# Patient Record
Sex: Female | Born: 1970 | Race: White | Hispanic: No | Marital: Married | Smoking: Never smoker
Health system: Southern US, Community
[De-identification: ages and names within clinical notes are randomized; demographics above are authoritative.]

## PROBLEM LIST (undated history)

## (undated) DIAGNOSIS — G4733 Obstructive sleep apnea (adult) (pediatric): Secondary | ICD-10-CM

## (undated) DIAGNOSIS — E785 Hyperlipidemia, unspecified: Secondary | ICD-10-CM

## (undated) DIAGNOSIS — I10 Essential (primary) hypertension: Secondary | ICD-10-CM

## (undated) DIAGNOSIS — I440 Atrioventricular block, first degree: Secondary | ICD-10-CM

## (undated) DIAGNOSIS — E7800 Pure hypercholesterolemia, unspecified: Secondary | ICD-10-CM

## (undated) DIAGNOSIS — F419 Anxiety disorder, unspecified: Secondary | ICD-10-CM

## (undated) DIAGNOSIS — Z8719 Personal history of other diseases of the digestive system: Secondary | ICD-10-CM

## (undated) DIAGNOSIS — K5190 Ulcerative colitis, unspecified, without complications: Secondary | ICD-10-CM

## (undated) DIAGNOSIS — I471 Supraventricular tachycardia: Secondary | ICD-10-CM

## (undated) DIAGNOSIS — F411 Generalized anxiety disorder: Secondary | ICD-10-CM

## (undated) DIAGNOSIS — Z973 Presence of spectacles and contact lenses: Secondary | ICD-10-CM

## (undated) DIAGNOSIS — J309 Allergic rhinitis, unspecified: Secondary | ICD-10-CM

## (undated) DIAGNOSIS — K219 Gastro-esophageal reflux disease without esophagitis: Secondary | ICD-10-CM

## (undated) DIAGNOSIS — Z889 Allergy status to unspecified drugs, medicaments and biological substances status: Secondary | ICD-10-CM

## (undated) DIAGNOSIS — N920 Excessive and frequent menstruation with regular cycle: Secondary | ICD-10-CM

## (undated) HISTORY — DX: Essential (primary) hypertension: I10

## (undated) HISTORY — DX: Supraventricular tachycardia: I47.1

## (undated) HISTORY — DX: Gastro-esophageal reflux disease without esophagitis: K21.9

## (undated) HISTORY — DX: Allergy status to unspecified drugs, medicaments and biological substances: Z88.9

## (undated) HISTORY — DX: Pure hypercholesterolemia, unspecified: E78.00

## (undated) HISTORY — PX: OTHER SURGICAL HISTORY: SHX169

## (undated) HISTORY — DX: Anxiety disorder, unspecified: F41.9

## (undated) HISTORY — DX: Ulcerative colitis, unspecified, without complications: K51.90

---

## 1998-07-10 ENCOUNTER — Other Ambulatory Visit: Admission: RE | Admit: 1998-07-10 | Discharge: 1998-07-10 | Payer: Self-pay | Admitting: Gynecology

## 1999-10-03 ENCOUNTER — Other Ambulatory Visit: Admission: RE | Admit: 1999-10-03 | Discharge: 1999-10-03 | Payer: Self-pay | Admitting: Gynecology

## 2000-10-19 ENCOUNTER — Other Ambulatory Visit: Admission: RE | Admit: 2000-10-19 | Discharge: 2000-10-19 | Payer: Self-pay | Admitting: Gynecology

## 2001-11-09 ENCOUNTER — Other Ambulatory Visit: Admission: RE | Admit: 2001-11-09 | Discharge: 2001-11-09 | Payer: Self-pay | Admitting: Gynecology

## 2002-01-10 ENCOUNTER — Ambulatory Visit (HOSPITAL_COMMUNITY): Admission: RE | Admit: 2002-01-10 | Discharge: 2002-01-10 | Payer: Self-pay | Admitting: Gastroenterology

## 2002-01-10 ENCOUNTER — Encounter (INDEPENDENT_AMBULATORY_CARE_PROVIDER_SITE_OTHER): Payer: Self-pay

## 2002-12-05 ENCOUNTER — Other Ambulatory Visit: Admission: RE | Admit: 2002-12-05 | Discharge: 2002-12-05 | Payer: Self-pay | Admitting: Gynecology

## 2004-01-11 ENCOUNTER — Other Ambulatory Visit: Admission: RE | Admit: 2004-01-11 | Discharge: 2004-01-11 | Payer: Self-pay | Admitting: Gynecology

## 2005-02-24 ENCOUNTER — Other Ambulatory Visit: Admission: RE | Admit: 2005-02-24 | Discharge: 2005-02-24 | Payer: Self-pay | Admitting: Gynecology

## 2005-10-02 ENCOUNTER — Inpatient Hospital Stay (HOSPITAL_COMMUNITY): Admission: AD | Admit: 2005-10-02 | Discharge: 2005-10-05 | Payer: Self-pay | Admitting: Obstetrics and Gynecology

## 2009-03-24 ENCOUNTER — Emergency Department (HOSPITAL_BASED_OUTPATIENT_CLINIC_OR_DEPARTMENT_OTHER): Admission: EM | Admit: 2009-03-24 | Discharge: 2009-03-24 | Payer: Self-pay | Admitting: Emergency Medicine

## 2009-03-26 ENCOUNTER — Emergency Department (HOSPITAL_BASED_OUTPATIENT_CLINIC_OR_DEPARTMENT_OTHER): Admission: EM | Admit: 2009-03-26 | Discharge: 2009-03-27 | Payer: Self-pay | Admitting: Emergency Medicine

## 2009-03-26 ENCOUNTER — Ambulatory Visit: Payer: Self-pay | Admitting: Radiology

## 2009-03-26 ENCOUNTER — Ambulatory Visit (HOSPITAL_BASED_OUTPATIENT_CLINIC_OR_DEPARTMENT_OTHER): Admission: RE | Admit: 2009-03-26 | Discharge: 2009-03-26 | Payer: Self-pay | Admitting: Emergency Medicine

## 2009-03-29 ENCOUNTER — Ambulatory Visit (HOSPITAL_COMMUNITY): Admission: RE | Admit: 2009-03-29 | Discharge: 2009-03-30 | Payer: Self-pay | Admitting: General Surgery

## 2009-03-29 ENCOUNTER — Encounter (INDEPENDENT_AMBULATORY_CARE_PROVIDER_SITE_OTHER): Payer: Self-pay | Admitting: General Surgery

## 2009-03-29 HISTORY — PX: CHOLECYSTECTOMY, LAPAROSCOPIC: SHX56

## 2010-07-15 LAB — CBC
Hemoglobin: 11.4 g/dL — ABNORMAL LOW (ref 12.0–15.0)
MCHC: 32.7 g/dL (ref 30.0–36.0)
Platelets: 356 10*3/uL (ref 150–400)
RDW: 14.9 % (ref 11.5–15.5)

## 2010-07-15 LAB — DIFFERENTIAL
Eosinophils Relative: 0 % (ref 0–5)
Lymphs Abs: 1.2 10*3/uL (ref 0.7–4.0)
Neutro Abs: 9.9 10*3/uL — ABNORMAL HIGH (ref 1.7–7.7)
Neutrophils Relative %: 84 % — ABNORMAL HIGH (ref 43–77)

## 2010-07-16 LAB — COMPREHENSIVE METABOLIC PANEL
ALT: 9 U/L (ref 0–35)
AST: 12 U/L (ref 0–37)
Calcium: 10 mg/dL (ref 8.4–10.5)
Calcium: 9.3 mg/dL (ref 8.4–10.5)
Chloride: 103 mEq/L (ref 96–112)
Creatinine, Ser: 0.7 mg/dL (ref 0.4–1.2)
GFR calc Af Amer: >60 mL/min (ref >60)
GFR calc non Af Amer: >60 mL/min (ref >60)
Potassium: 4.3 mEq/L (ref 3.5–5.1)
Sodium: 142 mEq/L (ref 135–145)
Total Bilirubin: 0.6 mg/dL (ref 0.3–1.2)
Total Bilirubin: 0.8 mg/dL (ref 0.3–1.2)

## 2010-07-16 LAB — CBC
HCT: 39 % (ref 36.0–46.0)
HCT: 46.8 % — ABNORMAL HIGH (ref 36.0–46.0)
Hemoglobin: 15.8 g/dL — ABNORMAL HIGH (ref 12.0–15.0)
MCHC: 33.6 g/dL (ref 30.0–36.0)
MCHC: 33.7 g/dL (ref 30.0–36.0)
MCV: 80.3 fL (ref 78.0–100.0)
MCV: 80.5 fL (ref 78.0–100.0)
Platelets: 401 K/uL — ABNORMAL HIGH (ref 150–400)
RBC: 4.85 MIL/uL (ref 3.87–5.11)
RBC: 5.81 MIL/uL — ABNORMAL HIGH (ref 3.87–5.11)
RDW: 13.4 % (ref 11.5–15.5)
RDW: 13.6 % (ref 11.5–15.5)
WBC: 17.3 K/uL — ABNORMAL HIGH (ref 4.0–10.5)

## 2010-07-16 LAB — DIFFERENTIAL
Basophils Relative: 4 % — ABNORMAL HIGH (ref 0–1)
Eosinophils Relative: 0 % (ref 0–5)
Lymphs Abs: 1.5 10*3/uL (ref 0.7–4.0)
Monocytes Absolute: 0.6 10*3/uL (ref 0.1–1.0)
Monocytes Relative: 4 % (ref 3–12)
Monocytes Relative: 5 % (ref 3–12)
Neutro Abs: 11.7 10*3/uL — ABNORMAL HIGH (ref 1.7–7.7)

## 2010-07-16 LAB — URINALYSIS, ROUTINE W REFLEX MICROSCOPIC
Glucose, UA: NEGATIVE mg/dL
Ketones, ur: >80 mg/dL — AB
Nitrite: NEGATIVE
Protein, ur: 100 mg/dL — AB
Specific Gravity, Urine: 1.033 — ABNORMAL HIGH (ref 1.005–1.030)
Urobilinogen, UA: 1 mg/dL (ref 0.0–1.0)
pH: 6.5 (ref 5.0–8.0)

## 2010-07-16 LAB — URINE MICROSCOPIC-ADD ON

## 2010-07-16 LAB — LIPASE, BLOOD: Lipase: 30 U/L (ref 23–300)

## 2010-07-16 LAB — PREGNANCY, URINE: Preg Test, Ur: NEGATIVE

## 2010-08-30 NOTE — Discharge Summary (Signed)
NAME:  ELIAS, BORDNER NO.:  [id number]   MEDICAL RECORD NO.:  [id number]          PATIENT TYPE:  INP   LOCATION:  9125                          FACILITY:  [facility]   PHYSICIAN:  Ilda Mori, M.D.   DATE OF BIRTH:  [birth year]   DATE OF ADMISSION:  10/02/2005  DATE OF DISCHARGE:  10/05/2005                                 DISCHARGE SUMMARY   FINAL DIAGNOSES:  1.  Intrauterine pregnancy at [redacted] weeks gestation.  2.  Spontaneous rupture of membranes.  3.  Breech presentation.   PROCEDURE:  Primary low flap transverse cesarean section.   SURGEON:  Dr. Miguel Aschoff.   COMPLICATIONS:  None.   HISTORY:  This 40 year old G1 and P0 presents at 40 and [redacted] weeks gestation  with spontaneous rupture of membranes.  The patient's antepartum course up  to this point has been uncomplicated.  The patient is advanced maternal age.  The patient did have amniocentesis.  The patient did see [facility] for  genetic counseling.  I do not see where she had an amnio or not.  She also  has a history of ulcerative colitis but seemed to be stable during her  pregnancy.   On admission, to the [facility], the patient was noted to have a  breech presentation.  At this time, a decision was made to proceed with a  cesarean section.  The patient was taken the operating room on October 02, 2005  by Dr. Miguel Aschoff where a primary low flap transverse cesarean section was  performed with the delivery of a 6 pounds 11 ounce female infant with Apgar's  of 9 and 9.  Delivery went without complications.  The patient's  postoperative course was benign without significant fevers.  She was felt  ready for discharge on postoperative day #3, was sent home on a regular  diet, told to decrease activities, told to continue prenatal vitamins, was  given Tylox #30 one to two every 4 hours as needed for pain, was to follow  up in the office in four weeks.   LABS ON DISCHARGE:  The patient had a hemoglobin of  9.4, white blood cell  count of 8.9, platelets 238,000.      Leilani Able, P.A.-C.      Ilda Mori, M.D.  Electronically Signed    MB/MEDQ  D:  11/13/2005  T:  11/14/2005  Job:  [id number]

## 2010-08-30 NOTE — Op Note (Signed)
NAME:  Carrie Savage, Carrie Savage NO.:  [id number]   MEDICAL RECORD NO.:  [id number]          PATIENT TYPE:  INP   LOCATION:  9125                          FACILITY:  [facility]   PHYSICIAN:  Miguel Aschoff, M.D.       DATE OF BIRTH:  07/10/1970   DATE OF PROCEDURE:  10/02/2005  DATE OF DISCHARGE:                                 OPERATIVE REPORT   PREOPERATIVE DIAGNOSES:  1.  Intrauterine pregnancy at 36 weeks.  2.  Spontaneous rupture of membranes.  3.  Breech presentation.   POSTOPERATIVE DIAGNOSES:  1.  Intrauterine pregnancy at 36 weeks.  2.  Spontaneous rupture of membranes.  3.  Breech presentation.  4.  Delivery of a viable female infant, Apgar 9/9.   PROCEDURE:  Primary low flap transverse cesarean section.   SURGEON:  Miguel Aschoff, M.D.   ANESTHESIA:  Spinal.   COMPLICATIONS:  None.   JUSTIFICATION:  The patient is a 40 year old white female at approximately  70 weeks' gestation who has had spontaneous rupture of membranes.  On  examination she appeared to have a malpresentation and an ultrasound  examination was carried out and revealed a breech presentation.  With the  malpresentation in a primigravida, it was recommended that the patient  undergo a primary cesarean section.  The risks and benefits were discussed  with the patient and informed consent was obtained.   PROCEDURE:  The patient was taken to the operating room and placed in a  sitting position and spinal anesthesia was administered without difficulty.  After this was done, she was placed in a supine position and prepped and  draped in the usual sterile fashion.  A Foley catheter was inserted.  At  this point a Pfannenstiel incision was made and extended down into the  subcutaneous tissue with bleeding points being clamped and coagulated as  they were encountered.  The fascia was then identified and incised  transversely and then separated from the underlying rectus muscles.  The  rectus muscles were  divided in the midline, the peritoneum was then found  and entered, carefully avoiding underlying structures.  The peritoneal  incision was then extended under direct visualization.  At this point a  bladder flap was created and protected with a bladder blade.  Then an  elliptical transverse incision was made into the lower uterine segment.  The  amniotic cavity was entered and at this point the patient was delivered of a  viable female infant, Apgar 9 at one minute and 9 at five minutes from a frank  breech left sacrum anterior position.  The baby was handed to the pediatric  team in attendance.  Cord bloods were obtained for appropriate studies.  The  placenta was then delivered without difficulty.  The uterus was then  evacuated of any remaining products of conception.  Then the uterus was  closed.  The angles of the uterine incision were closed using figure-of-  eight sutures of #1 Vicryl.  Then the uterus was closed in layers.  The  first layer was a running interlocking  suture of #1 Vicryl, followed by an  imbricating suture of #1 Vicryl.  The bladder flap was reapproximated using  running continuous 2-0 Vicryl sutures.  At this point lap counts and  instrument counts were found to be correct and then the abdomen was closed.  The parietal peritoneum was closed using running continuous 0 Vicryl suture.  The rectus muscles were reapproximated using running continuous 0 Vicryl  sutures.  The fascia was then closed using 2 sutures of 0 Vicryl each  starting at the lateral fascial angles and meeting in the midline.  The  subcutaneous tissue was closed using interrupted 2-0 Vicryl sutures and the  skin incision was closed using staples.  The estimated blood loss was  approximately 600 mL.  The patient went to the recovery room in satisfactory  condition.      Miguel Aschoff, M.D.  Electronically Signed     AR/MEDQ  D:  10/02/2005  T:  10/03/2005  Job:  [id number]

## 2010-08-30 NOTE — Op Note (Signed)
   NAME:  Carrie Savage, Carrie Savage NO.:  [id number]   MEDICAL RECORD NO.:  [id number]                   PATIENT TYPE:  AMB   LOCATION:  ENDO                                 FACILITY:  [facility]   PHYSICIAN:  Danise Edge, M.D.                DATE OF BIRTH:  12-Jul-1970   DATE OF PROCEDURE:  01/10/2002  DATE OF DISCHARGE:                                 OPERATIVE REPORT   PROCEDURE:  Surveillance colonoscopy with biopsy.   INDICATIONS:  The patient is a 39 year old female born 05/17/1970.  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  She is off all  medication and for the past five to 10 years, her colitis has been inactive.  She is scheduled to undergo surveillance colonoscopy with biopsy to rule out  dysplasia.   ENDOSCOPIST:  Danise Edge, M.D.   PREMEDICATION:  Versed 10 mg, Demerol 100 mg.   ENDOSCOPE:  Olympus pediatric colonoscope.   DESCRIPTION OF PROCEDURE:  After obtaining informed consent, the patient was  placed in the left lateral decubitus position.  I administered intravenous  Demerol and intravenous Versed to achieve conscious sedation for the  procedure.  The patient's blood pressure, oxygen saturation, and cardiac  rhythm were monitored throughout the procedure and documented in the medical  record.   Anal inspection was normal.  Digital rectal exam was normal.  The Olympus  pediatric video colonoscope was introduced into the rectum and advanced to  the cecum.  The ileocecal valve was intubated and the distal ileum  inspected.  Colonic preparation for the exam today was excellent.   Rectum normal.   Sigmoid colon and descending colon normal.   Splenic flexure normal.   Transverse colon normal.   Hepatic flexure normal.   Ascending colon normal.   Cecum and ileocecal valve normal.   Distal ileum normal.   Biopsies:  Eight biopsies were taken from the left colon, eight biopsies  were taken from the  transverse colon, eight biopsies were taken from the  left colon, and seven biopsies were taken from the rectosigmoid colon.  Biopsies were submitted to rule out mucosal dysplasia.   ASSESSMENT:  Normal proctocolonoscopy with distal ileoscopy.  Random colonic  biopsies pending to rule out dysplasia.   RECOMMENDATIONS:  Repeat colonoscopy in three years.                                                 Danise Edge, M.D.    MJ/MEDQ  D:  01/10/2002  T:  01/10/2002  Job:  [id number]

## 2011-03-27 ENCOUNTER — Other Ambulatory Visit: Payer: Self-pay | Admitting: Gynecology

## 2011-09-02 ENCOUNTER — Ambulatory Visit
Admission: RE | Admit: 2011-09-02 | Discharge: 2011-09-02 | Disposition: A | Discharge status: Home / Self Care | Payer: BC Managed Care – PPO | Source: Ambulatory Visit | Attending: Allergy and Immunology | Admitting: Allergy and Immunology

## 2011-09-02 ENCOUNTER — Other Ambulatory Visit: Payer: Self-pay | Admitting: Allergy and Immunology

## 2011-09-02 DIAGNOSIS — R05 Cough: Secondary | ICD-10-CM

## 2011-09-02 DIAGNOSIS — R059 Cough, unspecified: Secondary | ICD-10-CM

## 2011-12-05 ENCOUNTER — Other Ambulatory Visit: Payer: Self-pay

## 2012-06-28 ENCOUNTER — Other Ambulatory Visit: Payer: Self-pay | Admitting: Gynecology

## 2013-07-25 ENCOUNTER — Other Ambulatory Visit: Payer: Self-pay | Admitting: Gynecology

## 2016-04-17 ENCOUNTER — Other Ambulatory Visit: Payer: Self-pay | Admitting: Gastroenterology

## 2016-06-10 ENCOUNTER — Encounter (HOSPITAL_COMMUNITY)
Admission: RE | Disposition: A | Discharge status: Home / Self Care | Payer: Self-pay | Source: Ambulatory Visit | Attending: Gastroenterology

## 2016-06-10 ENCOUNTER — Ambulatory Visit (HOSPITAL_COMMUNITY)
Admission: RE | Admit: 2016-06-10 | Discharge: 2016-06-10 | Disposition: A | Discharge status: Home / Self Care | Payer: BC Managed Care – PPO | Source: Ambulatory Visit | Attending: Gastroenterology | Admitting: Gastroenterology

## 2016-06-10 ENCOUNTER — Ambulatory Visit (HOSPITAL_COMMUNITY): Payer: BC Managed Care – PPO | Admitting: Anesthesiology

## 2016-06-10 ENCOUNTER — Encounter (HOSPITAL_COMMUNITY): Payer: Self-pay

## 2016-06-10 DIAGNOSIS — Z882 Allergy status to sulfonamides status: Secondary | ICD-10-CM | POA: Diagnosis not present

## 2016-06-10 DIAGNOSIS — Z1211 Encounter for screening for malignant neoplasm of colon: Secondary | ICD-10-CM | POA: Diagnosis not present

## 2016-06-10 DIAGNOSIS — E669 Obesity, unspecified: Secondary | ICD-10-CM | POA: Diagnosis not present

## 2016-06-10 DIAGNOSIS — E7800 Pure hypercholesterolemia, unspecified: Secondary | ICD-10-CM | POA: Diagnosis not present

## 2016-06-10 DIAGNOSIS — Z79899 Other long term (current) drug therapy: Secondary | ICD-10-CM | POA: Diagnosis not present

## 2016-06-10 DIAGNOSIS — F419 Anxiety disorder, unspecified: Secondary | ICD-10-CM | POA: Insufficient documentation

## 2016-06-10 DIAGNOSIS — I10 Essential (primary) hypertension: Secondary | ICD-10-CM | POA: Insufficient documentation

## 2016-06-10 DIAGNOSIS — Z6838 Body mass index (BMI) 38.0-38.9, adult: Secondary | ICD-10-CM | POA: Diagnosis not present

## 2016-06-10 DIAGNOSIS — K5100 Ulcerative (chronic) pancolitis without complications: Secondary | ICD-10-CM | POA: Insufficient documentation

## 2016-06-10 DIAGNOSIS — K219 Gastro-esophageal reflux disease without esophagitis: Secondary | ICD-10-CM | POA: Insufficient documentation

## 2016-06-10 HISTORY — PX: COLONOSCOPY WITH PROPOFOL: SHX5780

## 2016-06-10 SURGERY — COLONOSCOPY WITH PROPOFOL
Anesthesia: Monitor Anesthesia Care

## 2016-06-10 MED ORDER — PROPOFOL 500 MG/50ML IV EMUL
INTRAVENOUS | Status: DC | PRN
  Administered 2016-06-10: 50 mg via INTRAVENOUS
  Administered 2016-06-10: 60 mg via INTRAVENOUS

## 2016-06-10 MED ORDER — PROPOFOL 500 MG/50ML IV EMUL
INTRAVENOUS | Status: DC | PRN
  Administered 2016-06-10: 125 ug/kg/min via INTRAVENOUS

## 2016-06-10 MED ORDER — LACTATED RINGERS IV SOLN
INTRAVENOUS | Status: DC | PRN
  Administered 2016-06-10: 10:00:00 via INTRAVENOUS

## 2016-06-10 MED ORDER — MIDAZOLAM HCL 2 MG/2ML IJ SOLN
INTRAMUSCULAR | Status: AC
  Filled 2016-06-10: qty 2

## 2016-06-10 MED ORDER — MIDAZOLAM HCL 5 MG/5ML IJ SOLN
INTRAMUSCULAR | Status: DC | PRN
  Administered 2016-06-10: 2 mg via INTRAVENOUS

## 2016-06-10 MED ORDER — PROPOFOL 10 MG/ML IV BOLUS
INTRAVENOUS | Status: AC
  Filled 2016-06-10: qty 40

## 2016-06-10 SURGICAL SUPPLY — 22 items

## 2016-06-10 NOTE — H&P (Signed)
Procedure: Surveillance colonoscopy. 12/23/2004 normal surveillance colonoscopy was performed. Surveillance mucosal biopsies did not show colitis or dysplasia. Universal ulcerative proctocolitis was diagnosed in 1982.  History: The patient is a 46 year old female born 11/24/1970. She was diagnosed with Universal ulcerative colitis in 1982. She underwent a normal surveillance colonoscopy in September 2006. She does not take maintenance therapy for ulcerative colitis and has not had bloody diarrhea to suggest a flare in her colitis for at least 11 years.  She is scheduled to undergo surveillance colonoscopy today.  Medication allergies: Sulfa drugs cause hives. Lipitor and Crestor caused elevated liver transaminases. Lisinopril causes cough  Past medical history: Cholecystectomy. Cesarean section. Universal ulcerative colitis was diagnosed in 1982. Hypertension. Hypercholesterolemia. Anxiety. Gastroesophageal reflux.  Exam: The patient is alert and lying comfortably on the endoscopy stretcher. Abdomen is soft and nontender to palpation. Lungs are clear to auscultation. Cardiac exam reveals a regular rhythm.  Plan: Proceed with surveillance colonoscopy

## 2016-06-10 NOTE — Anesthesia Postprocedure Evaluation (Signed)
Anesthesia Post Note  Patient: Carrie Savage  Procedure(s) Performed: Procedure(s) (LRB): COLONOSCOPY WITH PROPOFOL (N/A)  Patient location during evaluation: PACU Anesthesia Type: MAC Level of consciousness: awake and alert Pain management: pain level controlled Vital Signs Assessment: post-procedure vital signs reviewed and stable Respiratory status: spontaneous breathing Cardiovascular status: stable Anesthetic complications: no       Last Vitals:  Vitals:   06/10/16 1020 06/10/16 1030  BP: (!) 128/55 (!) 150/88  Pulse: 69 70  Resp: 16 18  Temp:      Last Pain:  Vitals:   06/10/16 1016  TempSrc: Oral                 Nolon Nations

## 2016-06-10 NOTE — Anesthesia Preprocedure Evaluation (Signed)
Anesthesia Evaluation  Patient identified by MRN, date of birth, ID band Patient awake    Reviewed: Allergy & Precautions, NPO status , Patient's Chart, lab work & pertinent test results, reviewed documented beta blocker date and time   Airway Mallampati: II  TM Distance: >3 FB Neck ROM: Full    Dental no notable dental hx.    Pulmonary neg pulmonary ROS,    Pulmonary exam normal breath sounds clear to auscultation       Cardiovascular hypertension, Pt. on medications and Pt. on home beta blockers Normal cardiovascular exam Rhythm:Regular Rate:Normal     Neuro/Psych negative neurological ROS  negative psych ROS   GI/Hepatic negative GI ROS, Neg liver ROS,   Endo/Other  negative endocrine ROS  Renal/GU negative Renal ROS     Musculoskeletal negative musculoskeletal ROS (+)   Abdominal (+) + obese,   Peds  Hematology negative hematology ROS (+)   Anesthesia Other Findings   Reproductive/Obstetrics negative OB ROS                             Anesthesia Physical Anesthesia Plan  ASA: II  Anesthesia Plan: MAC   Post-op Pain Management:    Induction: Intravenous  Airway Management Planned:   Additional Equipment:   Intra-op Plan:   Post-operative Plan:   Informed Consent: I have reviewed the patients History and Physical, chart, labs and discussed the procedure including the risks, benefits and alternatives for the proposed anesthesia with the patient or authorized representative who has indicated his/her understanding and acceptance.   Dental advisory given  Plan Discussed with: CRNA  Anesthesia Plan Comments:         Anesthesia Quick Evaluation

## 2016-06-10 NOTE — Discharge Instructions (Signed)

## 2016-06-10 NOTE — Transfer of Care (Signed)
Immediate Anesthesia Transfer of Care Note  Patient: Carrie Savage  Procedure(s) Performed: Procedure(s): COLONOSCOPY WITH PROPOFOL (N/A)  Patient Location: PACU  Anesthesia Type:MAC  Level of Consciousness:  sedated, patient cooperative and responds to stimulation  Airway & Oxygen Therapy:Patient Spontanous Breathing and Patient connected to face mask oxgen  Post-op Assessment:  Report given to PACU RN and Post -op Vital signs reviewed and stable  Post vital signs:  Reviewed and stable  Last Vitals:  Vitals:   06/10/16 0853  BP: (!) 154/92  Pulse: 84  Resp: (!) 22  Temp: 77.8 C    Complications: No apparent anesthesia complications

## 2016-06-10 NOTE — Op Note (Signed)
[facility] Patient Name: Carrie Savage Procedure Date: 06/10/2016 MRN: [id number] Attending MD: Carrie Savage , MD Date of Birth: 09-09-1970 CSN: [id number] Age: 46 Admit Type: Outpatient Procedure:                Colonoscopy Indications:              High risk colon cancer surveillance: Ulcerative                            pancolitis was diagnosed in 1982 Providers:                Carrie Bumpers, MD, Carrie Edwards RN, RN, Carrie Savage, Technician, Carrie Right, CRNA Referring MD:              Medicines:                Propofol per Anesthesia Complications:            No immediate complications. Estimated Blood Loss:     Estimated blood loss was minimal. Procedure:                Pre-Anesthesia Assessment:                           - Prior to the procedure, a History and Physical                            was performed, and patient medications and                            allergies were reviewed. The patient's tolerance of                            previous anesthesia was also reviewed. The risks                            and benefits of the procedure and the sedation                            options and risks were discussed with the patient.                            All questions were answered, and informed consent                            was obtained. Prior Anticoagulants: The patient has                            taken no previous anticoagulant or antiplatelet                            agents. ASA Grade Assessment: II - A patient with  mild systemic disease. After reviewing the risks                            and benefits, the patient was deemed in                            satisfactory condition to undergo the procedure.                           After obtaining informed consent, the colonoscope                            was passed under direct vision. Throughout the   procedure, the patient's blood pressure, pulse, and                            oxygen saturations were monitored continuously. The                            EC-[id number] ([id number]) scope was introduced through                            the anus and advanced to the the cecum, identified                            by appendiceal orifice and ileocecal valve. The                            colonoscopy was performed without difficulty. The                            patient tolerated the procedure well. The quality                            of the bowel preparation was good. The terminal                            ileum, the ileocecal valve, the appendiceal orifice                            and the rectum were photographed. Scope In: 9:44:40 AM Scope Out: 10:09:16 AM Scope Withdrawal Time: 0 hours 19 minutes 51 seconds  Total Procedure Duration: 0 hours 24 minutes 36 seconds  Findings:      The perianal and digital rectal examinations were normal.      The entire examined colon appeared normal. Four quadrant biopsies were       performed approximately every 10 cm from the cecum to the rectum. A       total of 32 random colon biopsies were performed. Impression:               - The entire examined colon is normal.                           - No specimens collected. Moderate Sedation:  N/A- Per Anesthesia Care Recommendation:           - Patient has a contact number available for                            emergencies. The signs and symptoms of potential                            delayed complications were discussed with the                            patient. Return to normal activities tomorrow.                            Written discharge instructions were provided to the                            patient.                           - Repeat colonoscopy date to be determined after                            pending pathology results are reviewed for                             surveillance.                           - Resume previous diet.                           - Continue present medications. Procedure Code(s):        --- Professional ---                           Z6109G0105, Colorectal cancer screening; colonoscopy on                            individual at high risk Diagnosis Code(s):        --- Professional ---                           K51.00, Ulcerative (chronic) pancolitis without                            complications CPT copyright 2016 American Medical Association. All rights reserved. The codes documented in this report are preliminary and upon coder review may  be revised to meet current compliance requirements. Carrie EdgeMartin Jaterrius Ricketson, MD Carrie BumpersMartin K Zyiere Rosemond, MD 06/10/2016 10:16:17 AM This report has been signed electronically. Number of Addenda: 0

## 2016-06-11 ENCOUNTER — Encounter (HOSPITAL_COMMUNITY): Payer: Self-pay | Admitting: Gastroenterology

## 2016-10-12 DIAGNOSIS — Z8742 Personal history of other diseases of the female genital tract: Secondary | ICD-10-CM

## 2016-10-12 HISTORY — PX: LEEP: SHX91

## 2016-10-12 HISTORY — DX: Personal history of other diseases of the female genital tract: Z87.42

## 2017-07-21 ENCOUNTER — Telehealth: Payer: Self-pay

## 2017-07-21 NOTE — Telephone Encounter (Signed)
Sent notes to scheduling from Dr. Blair Heysobert Ehinger office. Phone: [phone], Fax: [phone]

## 2017-08-11 ENCOUNTER — Encounter: Payer: Self-pay | Admitting: Cardiology

## 2017-08-28 ENCOUNTER — Ambulatory Visit: Payer: BC Managed Care – PPO | Admitting: Cardiology

## 2017-08-31 ENCOUNTER — Telehealth: Payer: Self-pay | Admitting: *Deleted

## 2017-08-31 NOTE — Telephone Encounter (Signed)
NOTES FAXED TO [facility] FROM DR. Blair Heys [phone]

## 2017-09-02 NOTE — Progress Notes (Signed)
Referring-Robert Ehinger MD Reason for referral-palpitations  HPI: 47 year old female for evaluation of palpitations at request of Dr. Blair Heys.  Patient states she has had occasional palpitations for 2 years.  They are sudden in onset and described as her heart pounding.  They can last all day.  Possibly associated with stress.  There is mild dizziness but no syncope.  No associated dyspnea or chest pain.  She otherwise has mild dyspnea on exertion but no orthopnea, PND, pedal edema or exertional chest pain.  Because of the above cardiology asked to evaluate.  Current Outpatient Medications  Medication Sig Dispense Refill  . amLODipine (NORVASC) 2.5 MG tablet Take 2.5 mg by mouth daily.    . Calcium Carbonate-Vitamin D (CALCIUM 500 + D) 500-125 MG-UNIT TABS Take 1 tablet by mouth daily.    Marland Kitchen ezetimibe-simvastatin (VYTORIN) 10-80 MG tablet Take 1 tablet by mouth daily.    . metoprolol succinate (TOPROL-XL) 50 MG 24 hr tablet Take 50 mg by mouth daily. Take with or immediately following a meal.    . Multiple Vitamins-Minerals (MULTIVITAMIN WITH MINERALS) tablet Take 1 tablet by mouth daily.    . norethindrone-ethinyl estradiol 1/35 (ORTHO-NOVUM 1/35, 28,) tablet Take 1 tablet by mouth daily.    Marland Kitchen omeprazole (PRILOSEC) 40 MG capsule Take 80 mg by mouth every morning.     . ranitidine (ZANTAC) 150 MG tablet Take 300 mg by mouth at bedtime.    . sertraline (ZOLOFT) 100 MG tablet Take 100 mg by mouth daily.    . TELMISARTAN PO Take 1 tablet by mouth daily.     No current facility-administered medications for this visit.     Allergies  Allergen Reactions  . Sulfa Antibiotics Hives and Itching  . Crestor [Rosuvastatin] Other (See Comments)    ELEVATED LFT's  . Lipitor [Atorvastatin Calcium] Other (See Comments)    ELEVATED LFT's  . Lisinopril Diarrhea and Cough  . Pravastatin Other (See Comments)    INEFFECTIVE 2011     Past Medical History:  Diagnosis Date  . Anxiety   .  GERD (gastroesophageal reflux disease)   . Hypercholesterolemia   . Hypertension   . Multiple allergies   . Ulcerative colitis [facility])     Past Surgical History:  Procedure Laterality Date  . CESAREAN SECTION    . cholecystectomy    . COLONOSCOPY WITH PROPOFOL N/A 06/10/2016   Procedure: COLONOSCOPY WITH PROPOFOL;  Surgeon: Charolett Bumpers, MD;  Location: [facility];  Service: Endoscopy;  Laterality: N/A;  . LEEP  10/2016    Social History   Socioeconomic History  . Marital status: Married    Spouse name: Not on file  . Number of children: 1  . Years of education: Not on file  . Highest education level: Not on file  Occupational History  . Occupation: SEDENTARY  Social Needs  . Financial resource strain: Not on file  . Food insecurity:    Worry: Not on file    Inability: Not on file  . Transportation needs:    Medical: Not on file    Non-medical: Not on file  Tobacco Use  . Smoking status: Never Smoker  . Smokeless tobacco: Never Used  Substance and Sexual Activity  . Alcohol use: Yes    Comment: Occasional  . Drug use: Never  . Sexual activity: Not on file  Lifestyle  . Physical activity:    Days per week: Not on file    Minutes per session: Not on  file  . Stress: Not on file  Relationships  . Social connections:    Talks on phone: Not on file    Gets together: Not on file    Attends religious service: Not on file    Active member of club or organization: Not on file    Attends meetings of clubs or organizations: Not on file    Relationship status: Not on file  . Intimate partner violence:    Fear of current or ex partner: Not on file    Emotionally abused: Not on file    Physically abused: Not on file    Forced sexual activity: Not on file  Other Topics Concern  . Not on file  Social History Narrative  . Not on file    Family History  Problem Relation Age of Onset  . Hyperlipidemia Mother   . Hypertension Father   . Atrial fibrillation Father     . Heart attack Maternal Grandmother   . Diabetes Mellitus I Maternal Grandfather   . CAD Paternal Grandfather   . Obesity Paternal Grandfather     ROS: no fevers or chills, productive cough, hemoptysis, dysphasia, odynophagia, melena, hematochezia, dysuria, hematuria, rash, seizure activity, orthopnea, PND, pedal edema, claudication. Remaining systems are negative.  Physical Exam:   Blood pressure (!) 143/83, pulse 83, height 5' 6.5" (1.689 m), weight 239 lb 12.8 oz (108.8 kg).  General:  Well developed/well nourished in NAD Skin warm/dry Patient not depressed No peripheral clubbing Back-normal HEENT-normal/normal eyelids Neck supple/normal carotid upstroke bilaterally; no bruits; no JVD; no thyromegaly chest - CTA/ normal expansion CV - RRR/normal S1 and S2; no murmurs, rubs or gallops;  PMI nondisplaced Abdomen -NT/ND, no HSM, no mass, + bowel sounds, no bruit 2+ femoral pulses, no bruits Ext-no edema, chords, 2+ DP Neuro-grossly nonfocal  ECG -normal sinus rhythm at a rate of 83.  No ST changes.  Personally reviewed  A/P  1 palpitations-etiology unclear.  Electrocardiogram normal.  We discussed an event monitor today but her symptoms are unpredictable.  They do last hours at a time and I asked her to obtain a rhythm strip if possible at urgent care or the fire station.  We will treat based on those results.  I will arrange an echocardiogram to assess LV function.  Continue metoprolol.  This can be advanced in the future if needed.  2 hypertension-blood pressure is elevated but she has not taken her medications this morning.  She will follow and we will advance as needed.  Increasing metoprolol would likely be a good choice as this could also treat palpitations.  3 hyperlipidemia-continue statin.  Olga Millers, MD

## 2017-09-09 ENCOUNTER — Encounter: Payer: Self-pay | Admitting: Cardiology

## 2017-09-09 ENCOUNTER — Ambulatory Visit: Payer: BC Managed Care – PPO | Admitting: Cardiology

## 2017-09-09 VITALS — BP 143/83 | HR 83 | Ht 66.5 in | Wt 239.8 lb

## 2017-09-09 DIAGNOSIS — I10 Essential (primary) hypertension: Secondary | ICD-10-CM | POA: Diagnosis not present

## 2017-09-09 DIAGNOSIS — R002 Palpitations: Secondary | ICD-10-CM

## 2017-09-09 DIAGNOSIS — E7800 Pure hypercholesterolemia, unspecified: Secondary | ICD-10-CM | POA: Diagnosis not present

## 2017-09-09 NOTE — Patient Instructions (Signed)
Medication Instructions:   NO CHANGE  Testing/Procedures:  Your physician has requested that you have an echocardiogram. Echocardiography is a painless test that uses sound waves to create images of your heart. It provides your doctor with information about the size and shape of your heart and how well your heart's chambers and valves are working. This procedure takes approximately one hour. There are no restrictions for this procedure.    Follow-Up:  Your physician wants you to follow-up in: 6 MONTHS WITH DR CRENSHAW You will receive a reminder letter in the mail two months in advance. If you don't receive a letter, please call our office to schedule the follow-up appointment.      

## 2017-09-11 NOTE — Addendum Note (Signed)
Addended by: Freddi StarrMATHIS, Moreen Piggott W on: 09/11/2017 03:02 PM   Modules accepted: Orders

## 2017-09-12 DIAGNOSIS — I471 Supraventricular tachycardia, unspecified: Secondary | ICD-10-CM

## 2017-09-12 HISTORY — DX: Supraventricular tachycardia: I47.1

## 2017-09-12 HISTORY — DX: Supraventricular tachycardia, unspecified: I47.10

## 2017-09-14 ENCOUNTER — Telehealth: Payer: Self-pay | Admitting: Physician Assistant

## 2017-09-14 NOTE — Telephone Encounter (Signed)
Appointment made for patient with Carrie Savage on 09/17/2017.

## 2017-09-14 NOTE — Telephone Encounter (Signed)
Ms. Carrie Savage called the office because she was having palpitations.  She was not sure what to do but was aware her heart rate was very high, 155 per her Fitbit.  She went to the local fire department, and they told her to go to the emergency room.  However, her blood pressure was stable.  She went home and her symptoms resolved.  Her blood pressure is currently normal and her heart rate is 92 according to her Fitbit.  She was able to fax me the ECG performed at the fire department.  It showed SVT, heart rate 166.  She is still asymptomatic.  She is to continue the beta-blocker, contact us if she gets symptoms again and call 911 if they are associated with shortness of breath, presyncope or chest pain.  I advised route this to [place]. [place]shaw [place] to get her an appointment.  Theodore Demarkhonda Barrett, PA-C

## 2017-09-14 NOTE — Addendum Note (Signed)
Addended by: Freddi StarrMATHIS, Mayumi Summerson W on: 09/14/2017 09:07 AM   Modules accepted: Orders

## 2017-09-17 ENCOUNTER — Ambulatory Visit: Payer: BC Managed Care – PPO | Admitting: Physician Assistant

## 2017-09-17 ENCOUNTER — Encounter: Payer: Self-pay | Admitting: Physician Assistant

## 2017-09-17 VITALS — BP 136/86 | HR 89 | Ht 66.5 in | Wt 237.0 lb

## 2017-09-17 DIAGNOSIS — E785 Hyperlipidemia, unspecified: Secondary | ICD-10-CM | POA: Diagnosis not present

## 2017-09-17 DIAGNOSIS — I10 Essential (primary) hypertension: Secondary | ICD-10-CM

## 2017-09-17 DIAGNOSIS — I479 Paroxysmal tachycardia, unspecified: Secondary | ICD-10-CM | POA: Diagnosis not present

## 2017-09-17 MED ORDER — METOPROLOL SUCCINATE ER 50 MG PO TB24: 50.0000 mg | ORAL_TABLET | Freq: Two times a day (BID) | ORAL | 6 refills | Status: DC

## 2017-09-17 NOTE — Progress Notes (Signed)
Cardiology Office Note    Date:  09/17/2017   ID:  Carrie Doffingoni E Kraszewski, DOB 03/03/1971, MRN [id number]  PCP:  Blair HeysEhinger, Robert, MD  Cardiologist:  Dr. Jens Somrenshaw  Chief Complaint  Patient presents with  . Follow-up    seen for Dr. Jens Somrenshaw. Palpitation    History of Present Illness:  Carrie Savage is a 47 y.o. female with PMH of hyperlipidemia, hypertension, and GERD.  She was evaluated by Dr. Jens Somrenshaw in the past for palpitation.  Her palpitation has been ongoing intermittently for the past 2 years.  Echocardiogram was recommended.  She is due to have echocardiogram on 6/14.  She went to the local fire station on 09/14/2017 with tachycardia palpitation.  Vital signs were stable.  Blood sugar was also normal as well.  She says the symptoms lasted a total of 45 hours before spontaneously resolved.  She did try Valsalva maneuver without much success.  The EKG strip that was obtained at the fire station showed SVT with retrograde P waves versus 2-1 atrial flutter.  The R-R interval was quite regular. I recommended to increase Toprol-XL to 50 mg twice daily.  I will also referred the patient to electrophysiology service for further assessment.  If felt this is atrial flutter, then she will need to start on systemic anticoagulation given CHA2DS2-Vasc score of 2 (hypertension and female).  Possibility of ablation can also be considered in the future.     Past Medical History:  Diagnosis Date  . Anxiety   . GERD (gastroesophageal reflux disease)   . Hypercholesterolemia   . Hypertension   . Multiple allergies   . Ulcerative colitis [facility](HCC)     Past Surgical History:  Procedure Laterality Date  . CESAREAN SECTION    . cholecystectomy    . COLONOSCOPY WITH PROPOFOL N/A 06/10/2016   Procedure: COLONOSCOPY WITH PROPOFOL;  Surgeon: Charolett BumpersMartin K Johnson, MD;  Location: [facility];  Service: Endoscopy;  Laterality: N/A;  . LEEP  10/2016    Current Medications: Outpatient Medications Prior to Visit    Medication Sig Dispense Refill  . Calcium Carbonate-Vitamin D (CALCIUM 500 + D) 500-125 MG-UNIT TABS Take 1 tablet by mouth daily.    Marland Kitchen. ezetimibe-simvastatin (VYTORIN) 10-80 MG tablet Take 1 tablet by mouth daily.    . Multiple Vitamins-Minerals (MULTIVITAMIN WITH MINERALS) tablet Take 1 tablet by mouth daily.    . norethindrone-ethinyl estradiol 1/35 (ORTHO-NOVUM 1/35, 28,) tablet Take 1 tablet by mouth daily.    Marland Kitchen. omeprazole (PRILOSEC) 40 MG capsule Take 80 mg by mouth every morning.     . ranitidine (ZANTAC) 150 MG tablet Take 300 mg by mouth at bedtime.    . sertraline (ZOLOFT) 100 MG tablet Take 100 mg by mouth daily.    . TELMISARTAN PO Take 1 tablet by mouth daily.    Marland Kitchen. amLODipine (NORVASC) 2.5 MG tablet Take 2.5 mg by mouth daily.    . metoprolol succinate (TOPROL-XL) 50 MG 24 hr tablet Take 50 mg by mouth daily. Take with or immediately following a meal.     No facility-administered medications prior to visit.      Allergies:   Sulfa antibiotics; Crestor [rosuvastatin]; Lipitor [atorvastatin calcium]; Lisinopril; and Pravastatin   Social History   Socioeconomic History  . Marital status: Married    Spouse name: Not on file  . Number of children: 1  . Years of education: Not on file  . Highest education level: Not on file  Occupational History  . Occupation:  SEDENTARY  Social Needs  . Financial resource strain: Not on file  . Food insecurity:    Worry: Not on file    Inability: Not on file  . Transportation needs:    Medical: Not on file    Non-medical: Not on file  Tobacco Use  . Smoking status: Never Smoker  . Smokeless tobacco: Never Used  Substance and Sexual Activity  . Alcohol use: Yes    Comment: Occasional  . Drug use: Never  . Sexual activity: Not on file  Lifestyle  . Physical activity:    Days per week: Not on file    Minutes per session: Not on file  . Stress: Not on file  Relationships  . Social connections:    Talks on phone: Not on file     Gets together: Not on file    Attends religious service: Not on file    Active member of club or organization: Not on file    Attends meetings of clubs or organizations: Not on file    Relationship status: Not on file  Other Topics Concern  . Not on file  Social History Narrative  . Not on file     Family History:  The patient's family history includes Atrial fibrillation in her father; CAD in her paternal grandfather; Diabetes Mellitus I in her maternal grandfather; Heart attack in her maternal grandmother; Hyperlipidemia in her mother; Hypertension in her father; Obesity in her paternal grandfather.   ROS:   Please see the history of present illness.    ROS All other systems reviewed and are negative.   PHYSICAL EXAM:   VS:  BP 136/86   Pulse 89   Ht 5' 6.5" (1.689 m)   Wt 237 lb (107.5 kg)   BMI 37.68 kg/m    GEN: Well nourished, well developed, in no acute distress  HEENT: normal  Neck: no JVD, carotid bruits, or masses Cardiac: RRR; no murmurs, rubs, or gallops,no edema  Respiratory:  clear to auscultation bilaterally, normal work of breathing GI: soft, nontender, nondistended, + BS MS: no deformity or atrophy  Skin: warm and dry, no rash Neuro:  Alert and Oriented x 3, Strength and sensation are intact Psych: euthymic mood, full affect  Wt Readings from Last 3 Encounters:  09/17/17 237 lb (107.5 kg)  09/09/17 239 lb 12.8 oz (108.8 kg)  06/10/16 240 lb (108.9 kg)      Studies/Labs Reviewed:   EKG:  EKG is ordered today.  The ekg ordered today demonstrates normal sinus rhythm without significant ST-T wave changes  Recent Labs: No results found for requested labs within last 8760 hours.   Lipid Panel No results found for: CHOL, TRIG, HDL, CHOLHDL, VLDL, LDLCALC, LDLDIRECT  Additional studies/ records that were reviewed today include:   N/A   ASSESSMENT:    1. Paroxysmal tachycardia (HCC)   2. Essential hypertension   3. Hyperlipidemia, unspecified  hyperlipidemia type      PLAN:  In order of problems listed above:  1. Paroxysmal tachycardia: Unclear if 2:1 atrial flutter versus SVT.  R-R interval is quite regular.  Increase Toprol-XL to 50 mg twice daily.  I will referred the patient to electrophysiology service for assessment.  If this is felt to be atrial flutter, patient will need systemic anticoagulation. CHA2DS2-Vasc score 2 (HTN, female)  2. Hypertension: Blood pressure mildly elevated, increase Toprol-XL to 50 mg twice daily for rate control purposes.  Stop amlodipine.  3. Hyperlipidemia: On Vytorin.  Will defer  annual lipid panel to primary care provider    Medication Adjustments/Labs and Tests Ordered: Current medicines are reviewed at length with the patient today.  Concerns regarding medicines are outlined above.  Medication changes, Labs and Tests ordered today are listed in the Patient Instructions below. Patient Instructions  Medication Instructions:  STOP AMLODIPINE  INCREASE METOPROLOL 50MG  TWICE DAILY  If you need a refill on your cardiac medications before your next appointment, please call your pharmacy.  Special Instructions: REFER TO ep-NON-URGENT  Follow-Up: Your physician wants you to follow-up in: 4-5 MONTHS WITH DR Jens Som You should receive a reminder letter in the mail two months in advance. If you do not receive a letter, please call our office [phone] to schedule the 01-2018 follow-up appointment.   Thank you for choosing [facility] at [facility], [place]  09/17/2017 6:09 PM    [facility] [address] [place], [place], [place]  [address] Phone: [phone]; Fax: [phone]

## 2017-09-17 NOTE — Patient Instructions (Signed)
Medication Instructions:  STOP AMLODIPINE  INCREASE METOPROLOL 50MG  TWICE DAILY  If you need a refill on your cardiac medications before your next appointment, please call your pharmacy.  Special Instructions: REFER TO ep-NON-URGENT  Follow-Up: Your physician wants you to follow-up in: 4-5 MONTHS WITH DR Jens SomRENSHAW You should receive a reminder letter in the mail two months in advance. If you do not receive a letter, please call our office 11-2017 to schedule the 01-2018 follow-up appointment.   Thank you for choosing [facility]

## 2017-09-24 ENCOUNTER — Encounter: Payer: Self-pay | Admitting: Internal Medicine

## 2017-09-24 ENCOUNTER — Ambulatory Visit: Payer: BC Managed Care – PPO | Admitting: Internal Medicine

## 2017-09-24 VITALS — BP 138/90 | HR 84 | Ht 66.5 in | Wt 238.0 lb

## 2017-09-24 DIAGNOSIS — I471 Supraventricular tachycardia: Secondary | ICD-10-CM | POA: Diagnosis not present

## 2017-09-24 DIAGNOSIS — R002 Palpitations: Secondary | ICD-10-CM | POA: Diagnosis not present

## 2017-09-24 NOTE — Progress Notes (Addendum)
HPI Ms. Carrie Savage is referred today by Azalee Course for evaluation of SVT. She has a h/o palpitations and presented to the fire station near her house where she was found to be in a narrow complex tachycardia at 175/min. The patient thinks that she has had an episode of prolonged heart racing once a month for the last year, more or less and these are associated with sob, mild chest pressure, and dizziness. She has never gotten IV adenosine but she is occaisionally able to stop her racing with valsalva maneuvers. Allergies  Allergen Reactions  . Sulfa Antibiotics Hives and Itching  . Crestor [Rosuvastatin] Other (See Comments)    ELEVATED LFT's  . Lipitor [Atorvastatin Calcium] Other (See Comments)    ELEVATED LFT's  . Lisinopril Diarrhea and Cough  . Pravastatin Other (See Comments)    INEFFECTIVE 2011     Current Outpatient Medications  Medication Sig Dispense Refill  . Calcium Carbonate-Vitamin D (CALCIUM 500 + D) 500-125 MG-UNIT TABS Take 1 tablet by mouth daily.    Marland Kitchen ezetimibe-simvastatin (VYTORIN) 10-80 MG tablet Take 1 tablet by mouth daily.    . metoprolol succinate (TOPROL-XL) 50 MG 24 hr tablet Take 1 tablet (50 mg total) by mouth 2 (two) times daily. Take with or immediately following a meal. 60 tablet 6  . Multiple Vitamins-Minerals (MULTIVITAMIN WITH MINERALS) tablet Take 1 tablet by mouth daily.    . norethindrone-ethinyl estradiol 1/35 (ORTHO-NOVUM 1/35, 28,) tablet Take 1 tablet by mouth daily.    Marland Kitchen omeprazole (PRILOSEC) 40 MG capsule Take 80 mg by mouth every morning.     . ranitidine (ZANTAC) 150 MG tablet Take 300 mg by mouth at bedtime.    . sertraline (ZOLOFT) 100 MG tablet Take 100 mg by mouth daily.    . TELMISARTAN PO Take 1 tablet by mouth daily.     No current facility-administered medications for this visit.      Past Medical History:  Diagnosis Date  . Anxiety   . GERD (gastroesophageal reflux disease)   . Hypercholesterolemia   . Hypertension   .  Multiple allergies   . Ulcerative colitis (HCC)     ROS:   All systems reviewed and negative except as noted in the HPI.   Past Surgical History:  Procedure Laterality Date  . CESAREAN SECTION    . cholecystectomy    . COLONOSCOPY WITH PROPOFOL N/A 06/10/2016   Procedure: COLONOSCOPY WITH PROPOFOL;  Surgeon: Charolett Bumpers, MD;  Location: [facility];  Service: Endoscopy;  Laterality: N/A;  . LEEP  10/2016     Family History  Problem Relation Age of Onset  . Hyperlipidemia Mother   . Hypertension Father   . Atrial fibrillation Father   . Heart attack Maternal Grandmother   . Diabetes Mellitus I Maternal Grandfather   . CAD Paternal Grandfather   . Obesity Paternal Grandfather      Social History   Socioeconomic History  . Marital status: Married    Spouse name: Not on file  . Number of children: 1  . Years of education: Not on file  . Highest education level: Not on file  Occupational History  . Occupation: SEDENTARY  Social Needs  . Financial resource strain: Not on file  . Food insecurity:    Worry: Not on file    Inability: Not on file  . Transportation needs:    Medical: Not on file    Non-medical: Not on file  Tobacco Use  .  Smoking status: Never Smoker  . Smokeless tobacco: Never Used  Substance and Sexual Activity  . Alcohol use: Yes    Comment: Occasional  . Drug use: Never  . Sexual activity: Not on file  Lifestyle  . Physical activity:    Days per week: Not on file    Minutes per session: Not on file  . Stress: Not on file  Relationships  . Social connections:    Talks on phone: Not on file    Gets together: Not on file    Attends religious service: Not on file    Active member of club or organization: Not on file    Attends meetings of clubs or organizations: Not on file    Relationship status: Not on file  . Intimate partner violence:    Fear of current or ex partner: Not on file    Emotionally abused: Not on file    Physically  abused: Not on file    Forced sexual activity: Not on file  Other Topics Concern  . Not on file  Social History Narrative  . Not on file     BP 138/90   Pulse 84   Ht 5' 6.5" (1.689 m)   Wt 238 lb (108 kg)   SpO2 95%   BMI 37.84 kg/m   Physical Exam:  Well appearing 47 yo woman, overweight,  NAD HEENT: Unremarkable Neck:  6 cm JVD, no thyromegally Lymphatics:  No adenopathy Back:  No CVA tenderness Lungs:  Clear with no wheezes HEART:  Regular rate rhythm, no murmurs, no rubs, no clicks Abd:  soft, obese, positive bowel sounds, no organomegally, no rebound, no guarding Ext:  2 plus pulses, no edema, no cyanosis, no clubbing Skin:  No rashes no nodules Neuro:  CN II through XII intact, motor grossly intact  EKG - nsr with no pre-excitation ECG in SVT reveals a short RP tachy at 175/min.   Assess/Plan: 1. SVT - I have discussed the treatment options with the patient. The risks/benefits/goals/expectations of the procedure have been reviewed and she will call us if she wishes to proceed with ablation. 2. Obesity - she has not been able to exercise out of concerns that her heart will start racing.  3. HTN - her blood pressure has been reasonably well controlled on the beta blocker. After ablations he may need a different blood pressure medication.  Leonia ReevesGregg Hollyanne Schloesser,M.D.

## 2017-09-24 NOTE — Patient Instructions (Addendum)
Medication Instructions:  Your physician recommends that you continue on your current medications as directed. Please refer to the Current Medication list given to you today.  Labwork: None ordered.  Testing/Procedures: Your physician has recommended that you have an ablation. Catheter ablation is a medical procedure used to treat some cardiac arrhythmias (irregular heartbeats). During catheter ablation, a long, thin, flexible tube is put into a blood vessel in your groin (upper thigh), or neck. This tube is called an ablation catheter. It is then guided to your heart through the blood vessel. Radio frequency waves destroy small areas of heart tissue where abnormal heartbeats may cause an arrhythmia to start. Please see the instruction sheet given to you today.   The following days are available for procedures:  Ablation days:  July 8, 10, 11, 15, 16, 18 and 29 August 13, 28 September 9, 16, 23  If you decide on a day please give me a call:  Dierdre HighmanJenny RN [phone]   Any Other Special Instructions Will Be Listed Below (If Applicable).  If you need a refill on your cardiac medications before your next appointment, please call your pharmacy.   Cardiac Ablation Cardiac ablation is a procedure to disable (ablate) a small amount of heart tissue in very specific places. The heart has many electrical connections. Sometimes these connections are abnormal and can cause the heart to beat very fast or irregularly. Ablating some of the problem areas can improve the heart rhythm or return it to normal. Ablation may be done for people who:  Have Wolff-Parkinson-White syndrome.  Have fast heart rhythms (tachycardia).  Have taken medicines for an abnormal heart rhythm (arrhythmia) that were not effective or caused side effects.  Have a high-risk heartbeat that may be life-threatening.  During the procedure, a small incision is made in the neck or the groin, and a long, thin, flexible tube  (catheter) is inserted into the incision and moved to the heart. Small devices (electrodes) on the tip of the catheter will send out electrical currents. A type of X-ray (fluoroscopy) will be used to help guide the catheter and to provide images of the heart. Tell a health care provider about:  Any allergies you have.  All medicines you are taking, including vitamins, herbs, eye drops, creams, and over-the-counter medicines.  Any problems you or family members have had with anesthetic medicines.  Any blood disorders you have.  Any surgeries you have had.  Any medical conditions you have, such as kidney failure.  Whether you are pregnant or may be pregnant. What are the risks? Generally, this is a safe procedure. However, problems may occur, including:  Infection.  Bruising and bleeding at the catheter insertion site.  Bleeding into the chest, especially into the sac that surrounds the heart. This is a serious complication.  Stroke or blood clots.  Damage to other structures or organs.  Allergic reaction to medicines or dyes.  Need for a permanent pacemaker if the normal electrical system is damaged. A pacemaker is a small computer that sends electrical signals to the heart and helps your heart beat normally.  The procedure not being fully effective. This may not be recognized until months later. Repeat ablation procedures are sometimes required.  What happens before the procedure?  Follow instructions from your health care provider about eating or drinking restrictions.  Ask your health care provider about: ? Changing or stopping your regular medicines. This is especially important if you are taking diabetes medicines or blood thinners. ? Taking  medicines such as aspirin and ibuprofen. These medicines can thin your blood. Do not take these medicines before your procedure if your health care provider instructs you not to.  Plan to have someone take you home from the hospital  or clinic.  If you will be going home right after the procedure, plan to have someone with you for 24 hours. What happens during the procedure?  To lower your risk of infection: ? Your health care team will wash or sanitize their hands. ? Your skin will be washed with soap. ? Hair may be removed from the incision area.  An IV tube will be inserted into one of your veins.  You will be given a medicine to help you relax (sedative).  The skin on your neck or groin will be numbed.  An incision will be made in your neck or your groin.  A needle will be inserted through the incision and into a large vein in your neck or groin.  A catheter will be inserted into the needle and moved to your heart.  Dye may be injected through the catheter to help your surgeon see the area of the heart that needs treatment.  Electrical currents will be sent from the catheter to ablate heart tissue in desired areas. There are three types of energy that may be used to ablate heart tissue: ? Heat (radiofrequency energy). ? Laser energy. ? Extreme cold (cryoablation).  When the necessary tissue has been ablated, the catheter will be removed.  Pressure will be held on the catheter insertion area to prevent excessive bleeding.  A bandage (dressing) will be placed over the catheter insertion area. The procedure may vary among health care providers and hospitals. What happens after the procedure?  Your blood pressure, heart rate, breathing rate, and blood oxygen level will be monitored until the medicines you were given have worn off.  Your catheter insertion area will be monitored for bleeding. You will need to lie still for a few hours to ensure that you do not bleed from the catheter insertion area.  Do not drive for 24 hours or as long as directed by your health care provider. Summary  Cardiac ablation is a procedure to disable (ablate) a small amount of heart tissue in very specific places. Ablating  some of the problem areas can improve the heart rhythm or return it to normal.  During the procedure, electrical currents will be sent from the catheter to ablate heart tissue in desired areas. This information is not intended to replace advice given to you by your health care provider. Make sure you discuss any questions you have with your health care provider. Document Released: 08/17/2008 Document Revised: 02/18/2016 Document Reviewed: 02/18/2016 Elsevier Interactive Patient Education  [employer].

## 2017-09-25 ENCOUNTER — Ambulatory Visit (HOSPITAL_BASED_OUTPATIENT_CLINIC_OR_DEPARTMENT_OTHER)
Admission: RE | Admit: 2017-09-25 | Discharge: 2017-09-25 | Disposition: A | Discharge status: Home / Self Care | Payer: BC Managed Care – PPO | Source: Ambulatory Visit | Attending: Cardiology | Admitting: Cardiology

## 2017-09-25 DIAGNOSIS — I5030 Unspecified diastolic (congestive) heart failure: Secondary | ICD-10-CM | POA: Insufficient documentation

## 2017-09-25 DIAGNOSIS — I10 Essential (primary) hypertension: Secondary | ICD-10-CM | POA: Diagnosis not present

## 2017-09-25 NOTE — Addendum Note (Signed)
Addended by: Micki RileySHOFFNER, Renada Cronin C on: 09/25/2017 11:08 AM   Modules accepted: Orders

## 2017-09-25 NOTE — Progress Notes (Signed)
  Echocardiogram 2D Echocardiogram has been performed.  Dorothey BasemanReel, Linzi Ohlinger M 09/25/2017, 9:42 AM

## 2017-09-30 ENCOUNTER — Ambulatory Visit: Payer: BC Managed Care – PPO | Admitting: Cardiology

## 2018-02-23 NOTE — Progress Notes (Deleted)
HPI: Follow-up SVT.  Echocardiogram June 2019 showed normal LV systolic function, mild diastolic dysfunction and mild left atrial enlargement.  Had episode of narrow complex SVT June 2019.  Seen by Dr. Ladona Ridgelaylor and ablation offered.  Beta-blocker was continued.  Since last seen  Current Outpatient Medications  Medication Sig Dispense Refill  . Calcium Carbonate-Vitamin D (CALCIUM 500 + D) 500-125 MG-UNIT TABS Take 1 tablet by mouth daily.    Marland Kitchen. ezetimibe-simvastatin (VYTORIN) 10-80 MG tablet Take 1 tablet by mouth daily.    . metoprolol succinate (TOPROL-XL) 50 MG 24 hr tablet Take 1 tablet (50 mg total) by mouth 2 (two) times daily. Take with or immediately following a meal. 60 tablet 6  . Multiple Vitamins-Minerals (MULTIVITAMIN WITH MINERALS) tablet Take 1 tablet by mouth daily.    . norethindrone-ethinyl estradiol 1/35 (ORTHO-NOVUM 1/35, 28,) tablet Take 1 tablet by mouth daily.    Marland Kitchen. omeprazole (PRILOSEC) 40 MG capsule Take 80 mg by mouth every morning.     . ranitidine (ZANTAC) 150 MG tablet Take 300 mg by mouth at bedtime.    . sertraline (ZOLOFT) 100 MG tablet Take 100 mg by mouth daily.    . TELMISARTAN PO Take 1 tablet by mouth daily.     No current facility-administered medications for this visit.      Past Medical History:  Diagnosis Date  . Anxiety   . GERD (gastroesophageal reflux disease)   . Hypercholesterolemia   . Hypertension   . Multiple allergies   . Ulcerative colitis [facility](HCC)     Past Surgical History:  Procedure Laterality Date  . CESAREAN SECTION    . cholecystectomy    . COLONOSCOPY WITH PROPOFOL N/A 06/10/2016   Procedure: COLONOSCOPY WITH PROPOFOL;  Surgeon: Charolett BumpersMartin K Johnson, MD;  Location: [facility];  Service: Endoscopy;  Laterality: N/A;  . LEEP  10/2016    Social History   Socioeconomic History  . Marital status: Married    Spouse name: Not on file  . Number of children: 1  . Years of education: Not on file  . Highest education level:  Not on file  Occupational History  . Occupation: SEDENTARY  Social Needs  . Financial resource strain: Not on file  . Food insecurity:    Worry: Not on file    Inability: Not on file  . Transportation needs:    Medical: Not on file    Non-medical: Not on file  Tobacco Use  . Smoking status: Never Smoker  . Smokeless tobacco: Never Used  Substance and Sexual Activity  . Alcohol use: Yes    Comment: Occasional  . Drug use: Never  . Sexual activity: Not on file  Lifestyle  . Physical activity:    Days per week: Not on file    Minutes per session: Not on file  . Stress: Not on file  Relationships  . Social connections:    Talks on phone: Not on file    Gets together: Not on file    Attends religious service: Not on file    Active member of club or organization: Not on file    Attends meetings of clubs or organizations: Not on file    Relationship status: Not on file  . Intimate partner violence:    Fear of current or ex partner: Not on file    Emotionally abused: Not on file    Physically abused: Not on file    Forced sexual activity: Not on file  Other Topics Concern  . Not on file  Social History Narrative  . Not on file    Family History  Problem Relation Age of Onset  . Hyperlipidemia Mother   . Hypertension Father   . Atrial fibrillation Father   . Heart attack Maternal Grandmother   . Diabetes Mellitus I Maternal Grandfather   . CAD Paternal Grandfather   . Obesity Paternal Grandfather     ROS: no fevers or chills, productive cough, hemoptysis, dysphasia, odynophagia, melena, hematochezia, dysuria, hematuria, rash, seizure activity, orthopnea, PND, pedal edema, claudication. Remaining systems are negative.  Physical Exam: Well-developed well-nourished in no acute distress.  Skin is warm and dry.  HEENT is normal.  Neck is supple.  Chest is clear to auscultation with normal expansion.  Cardiovascular exam is regular rate and rhythm.  Abdominal exam  nontender or distended. No masses palpated. Extremities show no edema. neuro grossly intact  ECG- personally reviewed  A/P  1 supraventricular tachycardia-plan to continue beta-blocker.  She has not had recurrent episodes since last evaluation.  She understands ablation can be pursued in the future if episodes become more frequent.  2 hypertension-blood pressure is controlled.  Continue present medications and follow.  3 hyperlipidemia-continue statin.  Olga Millers, MD

## 2018-03-03 ENCOUNTER — Ambulatory Visit: Payer: BC Managed Care – PPO | Admitting: Cardiology

## 2018-05-20 ENCOUNTER — Ambulatory Visit: Payer: BC Managed Care – PPO | Admitting: Cardiology

## 2018-05-20 ENCOUNTER — Encounter (INDEPENDENT_AMBULATORY_CARE_PROVIDER_SITE_OTHER): Payer: Self-pay

## 2018-05-20 ENCOUNTER — Encounter: Payer: Self-pay | Admitting: Cardiology

## 2018-05-20 ENCOUNTER — Telehealth: Payer: Self-pay | Admitting: *Deleted

## 2018-05-20 VITALS — BP 122/84 | HR 76 | Ht 66.0 in | Wt 240.0 lb

## 2018-05-20 DIAGNOSIS — I471 Supraventricular tachycardia, unspecified: Secondary | ICD-10-CM

## 2018-05-20 DIAGNOSIS — R400 Somnolence: Secondary | ICD-10-CM | POA: Diagnosis not present

## 2018-05-20 DIAGNOSIS — E785 Hyperlipidemia, unspecified: Secondary | ICD-10-CM | POA: Insufficient documentation

## 2018-05-20 DIAGNOSIS — E669 Obesity, unspecified: Secondary | ICD-10-CM

## 2018-05-20 DIAGNOSIS — I10 Essential (primary) hypertension: Secondary | ICD-10-CM | POA: Diagnosis not present

## 2018-05-20 NOTE — Telephone Encounter (Signed)
Left message to return a call to get sleep study appointment details. 

## 2018-05-20 NOTE — Assessment & Plan Note (Signed)
Controlled.  

## 2018-05-20 NOTE — Telephone Encounter (Signed)
-----   Message from Lucita Ferrara, [place] sent at 05/20/2018  9:15 AM EST ----- Regarding: SLEEP STUDY ORDERED A sleep study order has been placed for patient.   Thanks,  [employer]

## 2018-05-20 NOTE — Progress Notes (Signed)
05/20/2018 ARNIKA BYERLEY   [birth year]  [id number]  Primary Physician Blair Heys, MD Primary Cardiologist: Dr Jens Som- Dr Ladona Ridgel EP  HPI: Ms. Pennycuff is a pleasant 48 year old female seen in the office today for routine follow-up.  She has a history of PSVT.  Echocardiogram in June 2019 showed normal LV size and function with mild LVH.  Her left atrium was mildly dilated.  She saw Dr. Ladona Ridgel in June 2019.  He basically laid out options of medical therapy including extra beta-blocker on a as needed basis versus consideration of ablation therapy.  The patient opted for increased medical therapy.  She takes Toprol 50 mg twice daily and on occasion will take an extra 25 to 50 mg of Toprol for PSVT episodes.  In general she feels like she is doing well.  The episodes she has are less frequent and shorter duration.  Previously they would last for an hour or more, now 30 minutes or less.  She has an episode every other month or so although in January she admits she had 3 episodes.  She is not had chest pain associated with these.  She has a Fitbit watch and tracks her episodes.   Current Outpatient Medications  Medication Sig Dispense Refill  . Calcium Carbonate-Vitamin D (CALCIUM 500 + D) 500-125 MG-UNIT TABS Take 1 tablet by mouth daily.    Marland Kitchen ezetimibe-simvastatin (VYTORIN) 10-80 MG tablet Take 1 tablet by mouth daily.    . metoprolol succinate (TOPROL-XL) 50 MG 24 hr tablet Take 1 tablet (50 mg total) by mouth 2 (two) times daily. Take with or immediately following a meal. 60 tablet 6  . Multiple Vitamins-Minerals (MULTIVITAMIN WITH MINERALS) tablet Take 1 tablet by mouth daily.    . norethindrone-ethinyl estradiol 1/35 (ORTHO-NOVUM 1/35, 28,) tablet Take 1 tablet by mouth daily.    Marland Kitchen omeprazole (PRILOSEC) 40 MG capsule Take 40 mg by mouth every morning.     . sertraline (ZOLOFT) 100 MG tablet Take 100 mg by mouth daily.    . TELMISARTAN PO Take 1 tablet by mouth daily.     No current  facility-administered medications for this visit.     Allergies  Allergen Reactions  . Sulfa Antibiotics Hives and Itching  . Crestor [Rosuvastatin] Other (See Comments)    ELEVATED LFT's  . Lipitor [Atorvastatin Calcium] Other (See Comments)    ELEVATED LFT's  . Lisinopril Diarrhea and Cough  . Pravastatin Other (See Comments)    INEFFECTIVE 2011    Past Medical History:  Diagnosis Date  . Anxiety   . GERD (gastroesophageal reflux disease)   . Hypercholesterolemia   . Hypertension   . Multiple allergies   . Ulcerative colitis (HCC)     Social History   Socioeconomic History  . Marital status: Married    Spouse name: Not on file  . Number of children: 1  . Years of education: Not on file  . Highest education level: Not on file  Occupational History  . Occupation: SEDENTARY  Social Needs  . Financial resource strain: Not on file  . Food insecurity:    Worry: Not on file    Inability: Not on file  . Transportation needs:    Medical: Not on file    Non-medical: Not on file  Tobacco Use  . Smoking status: Never Smoker  . Smokeless tobacco: Never Used  Substance and Sexual Activity  . Alcohol use: Yes    Comment: Occasional  . Drug use: Never  .  Sexual activity: Not on file  Lifestyle  . Physical activity:    Days per week: Not on file    Minutes per session: Not on file  . Stress: Not on file  Relationships  . Social connections:    Talks on phone: Not on file    Gets together: Not on file    Attends religious service: Not on file    Active member of club or organization: Not on file    Attends meetings of clubs or organizations: Not on file    Relationship status: Not on file  . Intimate partner violence:    Fear of current or ex partner: Not on file    Emotionally abused: Not on file    Physically abused: Not on file    Forced sexual activity: Not on file  Other Topics Concern  . Not on file  Social History Narrative  . Not on file     Family  History  Problem Relation Age of Onset  . Hyperlipidemia Mother   . Hypertension Father   . Atrial fibrillation Father   . Heart attack Maternal Grandmother   . Diabetes Mellitus I Maternal Grandfather   . CAD Paternal Grandfather   . Obesity Paternal Grandfather      Review of Systems: General: negative for chills, fever, night sweats or weight changes.  Cardiovascular: negative for chest pain, dyspnea on exertion, edema, orthopnea,  paroxysmal nocturnal dyspnea or shortness of breath Dermatological: negative for rash Respiratory: negative for cough or wheezing Urologic: negative for hematuria Abdominal: negative for nausea, vomiting, diarrhea, bright red blood per rectum, melena, or hematemesis Neurologic: negative for visual changes, syncope, or dizziness Snoring, daytime fatigue All other systems reviewed and are otherwise negative except as noted above.    Blood pressure 122/84, pulse 76, height 5\' 6"  (1.676 m), weight 240 lb (108.9 kg).  General appearance: alert, cooperative, no distress and moderately obese Neck: no carotid bruit and no JVD Lungs: clear to auscultation bilaterally Heart: regular rate and rhythm Extremities: no edema Skin: Skin color, texture, turgor normal. No rashes or lesions Neurologic: Grossly normal  EKG NSR-incomplete RBBB  ASSESSMENT AND PLAN:   PSVT (paroxysmal supraventricular tachycardia) (HCC) H/O PSVT- currently doing OK on medical Rx  Dyslipidemia, goal LDL below 100 Followed by PCP  Obesity (BMI 30-39.9) Obesity, sedentary job, daytime fatigue, snoring- R/O sleep apnea  Essential hypertension Controlled   PLAN The patient feels like she is doing pretty well but when I reviewed her history she continues to have breakthrough PSVT episodes.  She does admit to snoring.  She does not exercise and has a sedentary job.  She has daytime fatigue.  I suspect she may have sleep apnea, both her father and her brother have sleep apnea.  I  think it is worth doing a sleep study and will arrange this.  For now we will continue her current therapy though if her episodes increase we may want to consider ablation again.  I will see her back in 6 months.  Corine Shelter PA-C 05/20/2018 9:02 AM

## 2018-05-20 NOTE — Patient Instructions (Addendum)
Medication Instructions:  Your physician recommends that you continue on your current medications as directed. Please refer to the Current Medication list given to you today. If you need a refill on your cardiac medications before your next appointment, please call your pharmacy.   Lab work: None  If you have labs (blood work) drawn today and your tests are completely normal, you will receive your results only by: Marland Kitchen MyChart Message (if you have MyChart) OR . A paper copy in the mail If you have any lab test that is abnormal or we need to change your treatment, we will call you to review the results.  Testing/Procedures: Your physician has recommended that you have a sleep study. This test records several body functions during sleep, including: brain activity, eye movement, oxygen and carbon dioxide blood levels, heart rate and rhythm, breathing rate and rhythm, the flow of air through your mouth and nose, snoring, body muscle movements, and chest and belly movement. WANDA OUR SLEEP COORDINATOR WILL BE IN CONTACT WITH YOU SOON.   Follow-Up: At [facility], you and your health needs are our priority.  As part of our continuing mission to provide you with exceptional heart care, we have created designated Provider Care Teams.  These Care Teams include your primary Cardiologist (physician) and Advanced Practice Providers (APPs -  Physician Assistants and Nurse Practitioners) who all work together to provide you with the care you need, when you need it. You will need a follow up appointment in 6 months.  Please call our office 2 months in advance to schedule this appointment.   Any Other Special Instructions Will Be Listed Below (If Applicable).

## 2018-05-20 NOTE — Assessment & Plan Note (Signed)
Followed by PCP

## 2018-05-20 NOTE — Assessment & Plan Note (Signed)
H/O PSVT- currently doing OK on medical Rx

## 2018-05-20 NOTE — Assessment & Plan Note (Signed)
Obesity, sedentary job, daytime fatigue, snoring- R/O sleep apnea

## 2018-05-24 NOTE — Telephone Encounter (Signed)
Left message to return a call for sleep study appointment details. 

## 2018-05-31 NOTE — Telephone Encounter (Signed)
Left sleep study appointment details on voice mail. 

## 2018-07-08 ENCOUNTER — Encounter (HOSPITAL_BASED_OUTPATIENT_CLINIC_OR_DEPARTMENT_OTHER): Payer: BC Managed Care – PPO

## 2018-09-13 ENCOUNTER — Encounter (HOSPITAL_BASED_OUTPATIENT_CLINIC_OR_DEPARTMENT_OTHER): Payer: BC Managed Care – PPO

## 2018-11-17 ENCOUNTER — Ambulatory Visit: Payer: BC Managed Care – PPO | Admitting: General Practice

## 2018-11-17 ENCOUNTER — Other Ambulatory Visit: Payer: Self-pay

## 2018-11-17 ENCOUNTER — Encounter: Payer: Self-pay | Admitting: Cardiology

## 2018-11-17 VITALS — BP 132/84 | HR 74 | Ht 66.0 in | Wt 241.6 lb

## 2018-11-17 DIAGNOSIS — I10 Essential (primary) hypertension: Secondary | ICD-10-CM

## 2018-11-17 DIAGNOSIS — E669 Obesity, unspecified: Secondary | ICD-10-CM | POA: Diagnosis not present

## 2018-11-17 DIAGNOSIS — I471 Supraventricular tachycardia, unspecified: Secondary | ICD-10-CM

## 2018-11-17 DIAGNOSIS — E7800 Pure hypercholesterolemia, unspecified: Secondary | ICD-10-CM | POA: Diagnosis not present

## 2018-11-17 DIAGNOSIS — R0683 Snoring: Secondary | ICD-10-CM

## 2018-11-17 MED ORDER — TELMISARTAN 80 MG PO TABS: 80.0000 mg | ORAL_TABLET | Freq: Every day | ORAL | 3 refills | Status: AC

## 2018-11-17 NOTE — Progress Notes (Signed)
Cardiology Clinic Note   Patient Name: Carrie Savage Date of Encounter: 11/17/2018  Primary Care Provider:  Gaynelle Arabian, MD Primary Cardiologist:  Kirk Ruths, MD  Patient Profile    Carrie Savage 48 year old female presents to the clinic today for follow-up of her PSVT.  Past Medical History    Past Medical History:  Diagnosis Date  . Anxiety   . GERD (gastroesophageal reflux disease)   . Hypercholesterolemia   . Hypertension   . Multiple allergies   . Ulcerative colitis [facility])    Past Surgical History:  Procedure Laterality Date  . CESAREAN SECTION    . cholecystectomy    . COLONOSCOPY WITH PROPOFOL N/A 06/10/2016   Procedure: COLONOSCOPY WITH PROPOFOL;  Surgeon: Garlan Fair, MD;  Location: [facility];  Service: Endoscopy;  Laterality: N/A;  . LEEP  10/2016    Allergies  Allergies  Allergen Reactions  . Sulfa Antibiotics Hives and Itching  . Crestor [Rosuvastatin] Other (See Comments)    ELEVATED LFT's  . Lipitor [Atorvastatin Calcium] Other (See Comments)    ELEVATED LFT's  . Lisinopril Diarrhea and Cough  . Pravastatin Other (See Comments)    INEFFECTIVE 2011    History of Present Illness    Ms. Care was last seen by Kerin Ransom, PA-C on 05/20/2018.  During that time she was doing well on medical therapy for her PSVT.  She was taking Toprol 50 mg twice daily and on occasion would take 25 to 50 mg of Toprol for breakthrough episodes of PSVT.  The episodes of PSVT that she did have were less frequent and lasting only about 30 minutes or less compared to an hour or more and she stated she had had only 3 episodes in January and then an episode every other month.  She denied chest pain with these episodes and stated that she had a Fitbit that was tracking her PSVT events.   Her echocardiogram in June 2019 showed normal LV size and function with mild LVH.  Her left atrium was mildly dilated.  She had also seen Dr. Lovena Le in June 2019 and he presented options  for medical therapy including extra beta-blocker on an as-needed basis versus the option of ablation therapy.   She presents to the clinic today and states feels well today.  She states she had one episode of PSVT in the last month while she was camping.  She states she had been exerting herself in the hot weather and was not staying hydrated.  She did not have any chest pain with the episode.  The PSVT lasted for about an hour and she took her metoprolol succinate 100 mg for conversion.  Her last episode of PSVT prior to this event was 2 months or more prior.  She denies chest pain, shortness of breath, lower extremity edema, fatigue, palpitations, melena, hematuria, hemoptysis, diaphoresis, weakness, presyncope, syncope, orthopnea, and PND.   Home Medications    Prior to Admission medications   Medication Sig Start Date End Date Taking? Authorizing Provider  Calcium Carbonate-Vitamin D (CALCIUM 500 + D) 500-125 MG-UNIT TABS Take 1 tablet by mouth daily.    [provider]  ezetimibe-simvastatin (VYTORIN) 10-80 MG tablet Take 1 tablet by mouth daily.    [provider]  metoprolol succinate (TOPROL-XL) 50 MG 24 hr tablet Take 1 tablet (50 mg total) by mouth 2 (two) times daily. Take with or immediately following a meal. 09/17/17   Almyra Deforest, PA  Multiple Vitamins-Minerals (MULTIVITAMIN  WITH MINERALS) tablet Take 1 tablet by mouth daily.    [provider]  norethindrone-ethinyl estradiol 1/35 (ORTHO-NOVUM 1/35, 28,) tablet Take 1 tablet by mouth daily.    [provider]  omeprazole (PRILOSEC) 40 MG capsule Take 40 mg by mouth every morning.     [provider]  sertraline (ZOLOFT) 100 MG tablet Take 100 mg by mouth daily.    [provider]  TELMISARTAN PO Take 1 tablet by mouth daily.    [provider]    Family History    Family History  Problem Relation Age of Onset  . Hyperlipidemia Mother   . Hypertension Father   .  Atrial fibrillation Father   . Heart attack Maternal Grandmother   . Diabetes Mellitus I Maternal Grandfather   . CAD Paternal Grandfather   . Obesity Paternal Grandfather    She indicated that her mother is alive. She indicated that her father is alive. She indicated that her brother is alive. She indicated that her maternal grandmother is deceased. She indicated that her maternal grandfather is deceased. She indicated that her paternal grandmother is deceased. She indicated that her paternal grandfather is deceased.  Social History    Social History   Socioeconomic History  . Marital status: Married    Spouse name: Not on file  . Number of children: 1  . Years of education: Not on file  . Highest education level: Not on file  Occupational History  . Occupation: SEDENTARY  Social Needs  . Financial resource strain: Not on file  . Food insecurity    Worry: Not on file    Inability: Not on file  . Transportation needs    Medical: Not on file    Non-medical: Not on file  Tobacco Use  . Smoking status: Never Smoker  . Smokeless tobacco: Never Used  Substance and Sexual Activity  . Alcohol use: Yes    Comment: Occasional  . Drug use: Never  . Sexual activity: Not on file  Lifestyle  . Physical activity    Days per week: Not on file    Minutes per session: Not on file  . Stress: Not on file  Relationships  . Social Musicianconnections    Talks on phone: Not on file    Gets together: Not on file    Attends religious service: Not on file    Active member of club or organization: Not on file    Attends meetings of clubs or organizations: Not on file    Relationship status: Not on file  . Intimate partner violence    Fear of current or ex partner: Not on file    Emotionally abused: Not on file    Physically abused: Not on file    Forced sexual activity: Not on file  Other Topics Concern  . Not on file  Social History Narrative  . Not on file     Review of Systems     General:  No chills, fever, night sweats or weight changes.  Cardiovascular:  No chest pain, dyspnea on exertion, edema, orthopnea, palpitations, paroxysmal nocturnal dyspnea. Dermatological: No rash, lesions/masses Respiratory: No cough, dyspnea Urologic: No hematuria, dysuria Abdominal:   No nausea, vomiting, diarrhea, bright red blood per rectum, melena, or hematemesis Neurologic:  No visual changes, wkns, changes in mental status. All other systems reviewed and are otherwise negative except as noted above.  Physical Exam    VS:  BP 132/84 (BP Location: Left Arm, Patient  Position: Sitting, Cuff Size: Normal)   Pulse 74   Ht 5\' 6"  (1.676 m)   Wt 241 lb 9.6 oz (109.6 kg)   SpO2 96%   BMI 39.00 kg/m  , BMI Body mass index is 39 kg/m. GEN: Well nourished, well developed, in no acute distress. HEENT: normal. Neck: Supple, no JVD, carotid bruits, or masses. Cardiac: RRR, no murmurs, rubs, or gallops. No clubbing, cyanosis, edema.  Radials/DP/PT 2+ and equal bilaterally.  Respiratory:  Respirations regular and unlabored, clear to auscultation bilaterally. GI: Soft, nontender, nondistended, BS + x 4. MS: no deformity or atrophy. Skin: warm and dry, no rash. Neuro:  Strength and sensation are intact. Psych: Normal affect.  Accessory Clinical Findings    ECG personally reviewed by me today-sinus rhythm with first-degree AV block 74 bpm   EKG 05/20/2018: Normal sinus rhythm incomplete right bundle branch block 76 bpm  Echocardiogram 09/25/2017: Study Conclusions  - Left ventricle: The cavity size was normal. Wall thickness was   increased in a pattern of mild LVH. Systolic function was normal.   The estimated ejection fraction was in the range of 60% to 65%.   Wall motion was normal; there were no regional wall motion   abnormalities. Doppler parameters are consistent with abnormal   left ventricular relaxation (grade 1 diastolic dysfunction). - Aortic valve: There was no  stenosis. - Mitral valve: Mildly calcified annulus. There was no significant   regurgitation. - Left atrium: The atrium was mildly dilated. - Right ventricle: The cavity size was normal. Systolic function   was normal. - Pulmonary arteries: No complete TR doppler jet so unable to   estimate PA systolic pressure. - Inferior vena cava: The vessel was normal in size. The   respirophasic diameter changes were in the normal range (>= 50%),   consistent with normal central venous pressure.  Impressions:  - Normal LV size with mild LV hypertrophy. EF 60-65%. Normal RV   size and systolic function. No significant valvular   abnormalities. Assessment & Plan   1.  PSVT (paroxysmal supraventricular tachycardia) echocardiogram LV normal size with mild LV hypertrophy EF 60-65%,Grade 1 diastolic disfunction  left atrium mildly dilated. Continue metoprolol succinate 50 mg twice daily and 25 to 50 mg for breakthrough PSVT episodes.   2.  Dyslipidemia- LDL104, 05/10/2018 Continue telmisartan 1 tablet p.o. daily Continue ezetimibe-simvastatin 10-80 mg tablet daily Monitored by PCP  3.  Obesity (BMI 30-39.9) Increase physical activity as tolerated-goal 150 minutes of moderate physical activity per week Heart healthy diet  4. Essential hypertension-well-controlled Continue metoprolol succinate 50 mg tablet twice daily Increase physical activity as tolerated Heart healthy low-sodium diet  5.  Snoring- no PND Patient does not wish to do sleep study at this time but is considering doing the test in the next 4 to 5 months.  Disposition: Follow-up with Dr. Jens Somrenshaw in 6 months.  Ronney AstersJesse M Celestino Ackerman, NP 11/17/2018, 8:54 AM

## 2018-11-17 NOTE — Patient Instructions (Addendum)
Medication Instructions:  Your physician recommends that you continue on your current medications as directed. Please refer to the Current Medication list given to you today. If you need a refill on your cardiac medications before your next appointment, please call your pharmacy.   Lab work: None  If you have labs (blood work) drawn today and your tests are completely normal, you will receive your results only by: Marland Kitchen. MyChart Message (if you have MyChart) OR . A paper copy in the mail If you have any lab test that is abnormal or we need to change your treatment, we will call you to review the results.  Testing/Procedures: None   Follow-Up: At [facility], you and your health needs are our priority.  As part of our continuing mission to provide you with exceptional heart care, we have created designated Provider Care Teams.  These Care Teams include your primary Cardiologist (physician) and Advanced Practice Providers (APPs -  Physician Assistants and Nurse Practitioners) who all work together to provide you with the care you need, when you need it. You will need a follow up appointment in 6 months.  Please call our office 2 months in advance to schedule this appointment.  You may see Olga MillersBrian Crenshaw, MD or one of the following Advanced Practice Providers on your designated Care Team:   Corine ShelterLuke Kilroy, PA-C Judy PimpleKrista Kroeger, [place] JerseyPA-C . Marjie Skiffallie Goodrich, PA-C  Any Other Special Instructions Will Be Listed Below (If Applicable). PLEASE CONTACT THE OFFICE WHEN YOU ARE READY TO DO YOUR SLEEP STUDY   Low-Sodium Eating Plan Sodium, which is an element that makes up salt, helps you maintain a healthy balance of fluids in your body. Too much sodium can increase your blood pressure and cause fluid and waste to be held in your body. Your health care provider or dietitian may recommend following this plan if you have high blood pressure (hypertension), kidney disease, liver disease, or heart failure. Eating less  sodium can help lower your blood pressure, reduce swelling, and protect your heart, liver, and kidneys. What are tips for following this plan? General guidelines  Most people on this plan should limit their sodium intake to 1,500-2,000 mg (milligrams) of sodium each day. Reading food labels   The Nutrition Facts label lists the amount of sodium in one serving of the food. If you eat more than one serving, you must multiply the listed amount of sodium by the number of servings.  Choose foods with less than 140 mg of sodium per serving.  Avoid foods with 300 mg of sodium or more per serving. Shopping  Look for lower-sodium products, often labeled as "low-sodium" or "no salt added."  Always check the sodium content even if foods are labeled as "unsalted" or "no salt added".  Buy fresh foods. ? Avoid canned foods and premade or frozen meals. ? Avoid canned, cured, or processed meats  Buy breads that have less than 80 mg of sodium per slice. Cooking  Eat more home-cooked food and less restaurant, buffet, and fast food.  Avoid adding salt when cooking. Use salt-free seasonings or herbs instead of table salt or sea salt. Check with your health care provider or pharmacist before using salt substitutes.  Cook with plant-based oils, such as canola, sunflower, or olive oil. Meal planning  When eating at a restaurant, ask that your food be prepared with less salt or no salt, if possible.  Avoid foods that contain MSG (monosodium glutamate). MSG is sometimes added to Cong[place]inese food, bouillon,  and some canned foods. What foods are recommended? The items listed may not be a complete list. Talk with your dietitian about what dietary choices are best for you. Grains Low-sodium cereals, including oats, puffed wheat and rice, and shredded wheat. Low-sodium crackers. Unsalted rice. Unsalted pasta. Low-sodium bread. Whole-grain breads and whole-grain pasta. Vegetables Fresh or frozen vegetables.  "No salt added" canned vegetables. "No salt added" tomato sauce and paste. Low-sodium or reduced-sodium tomato and vegetable juice. Fruits Fresh, frozen, or canned fruit. Fruit juice. Meats and other protein foods Fresh or frozen (no salt added) meat, poultry, seafood, and fish. Low-sodium canned tuna and salmon. Unsalted nuts. Dried peas, beans, and lentils without added salt. Unsalted canned beans. Eggs. Unsalted nut butters. Dairy Milk. Soy milk. Cheese that is naturally low in sodium, such as ricotta cheese, fresh mozzarella, or Swiss cheese Low-sodium or reduced-sodium cheese. Cream cheese. Yogurt. Fats and oils Unsalted butter. Unsalted margarine with no trans fat. Vegetable oils such as canola or olive oils. Seasonings and other foods Fresh and dried herbs and spices. Salt-free seasonings. Low-sodium mustard and ketchup. Sodium-free salad dressing. Sodium-free light mayonnaise. Fresh or refrigerated horseradish. Lemon juice. Vinegar. Homemade, reduced-sodium, or low-sodium soups. Unsalted popcorn and pretzels. Low-salt or salt-free chips. What foods are not recommended? The items listed may not be a complete list. Talk with your dietitian about what dietary choices are best for you. Grains Instant hot cereals. Bread stuffing, pancake, and biscuit mixes. Croutons. Seasoned rice or pasta mixes. Noodle soup cups. Boxed or frozen macaroni and cheese. Regular salted crackers. Self-rising flour. Vegetables Sauerkraut, pickled vegetables, and relishes. Olives. [place] fries. Onion rings. Regular canned vegetables (not low-sodium or reduced-sodium). Regular canned tomato sauce and paste (not low-sodium or reduced-sodium). Regular tomato and vegetable juice (not low-sodium or reduced-sodium). Frozen vegetables in sauces. Meats and other protein foods Meat or fish that is salted, canned, smoked, spiced, or pickled. Bacon, ham, sausage, hotdogs, corned beef, chipped beef, packaged lunch meats, salt  pork, jerky, pickled herring, anchovies, regular canned tuna, sardines, salted nuts. Dairy Processed cheese and cheese spreads. Cheese curds. Blue cheese. Feta cheese. String cheese. Regular cottage cheese. Buttermilk. Canned milk. Fats and oils Salted butter. Regular margarine. Ghee. Bacon fat. Seasonings and other foods Onion salt, garlic salt, seasoned salt, table salt, and sea salt. Canned and packaged gravies. Worcestershire sauce. Tartar sauce. Barbecue sauce. Teriyaki sauce. Soy sauce, including reduced-sodium. Steak sauce. Fish sauce. Oyster sauce. Cocktail sauce. Horseradish that you find on the shelf. Regular ketchup and mustard. Meat flavorings and tenderizers. Bouillon cubes. Hot sauce and Tabasco sauce. Premade or packaged marinades. Premade or packaged taco seasonings. Relishes. Regular salad dressings. Salsa. Potato and tortilla chips. Corn chips and puffs. Salted popcorn and pretzels. Canned or dried soups. Pizza. Frozen entrees and pot pies. Summary  Eating less sodium can help lower your blood pressure, reduce swelling, and protect your heart, liver, and kidneys.  Most people on this plan should limit their sodium intake to 1,500-2,000 mg (milligrams) of sodium each day.  Canned, boxed, and frozen foods are high in sodium. Restaurant foods, fast foods, and pizza are also very high in sodium. You also get sodium by adding salt to food.  Try to cook at home, eat more fresh fruits and vegetables, and eat less fast food, canned, processed, or prepared foods. This information is not intended to replace advice given to you by your health care provider. Make sure you discuss any questions you have with your health care provider. Document Released:  09/20/2001 Document Revised: 03/13/2017 Document Reviewed: 03/24/2016 Elsevier Patient Education  2020 [employer]   How to Increase Your Level of Physical Activity  Getting regular physical activity is important for your overall health  and well-being. Most people do not get enough exercise. There are easy ways to increase your level of physical activity, even if you have not been very active in the past or you are just starting out. Why is physical activity important? Physical activity has many short-term and long-term health benefits. Regular exercise can:  Help you lose weight or maintain a healthy weight.  Strengthen your muscles and bones.  Boost your mood and improve self-esteem.  Reduce your risk of certain long-term (chronic) diseases, like heart disease, cancer, and diabetes.  Help you stay capable of walking and moving around (mobile) as you age.  Prevent accidents, such as falls, as you age.  Increase life expectancy. What are the benefits of being physically active on a regular basis? In addition to improving your physical health, being physically active on most days of the week can help you in ways that you may not expect. Benefits of regular physical activity may include:  Feeling good about your body.  Being able to move around more easily and for longer periods of time without getting tired (increased stamina).  Finding new sources of fun and enjoyment.  Meeting new people who share a common interest.  Being able to fight off illness better (enhanced immunity).  Being able to sleep better. What can happen if I am not physically active on a regular basis? Not getting enough physical activity can lead to an unhealthy lifestyle and future health problems. This can increase your chances of:  Becoming overweight or obese.  Becoming sick.  Developing chronic illnesses, like heart disease or diabetes.  Having mental health problems, like depression or anxiety.  Having sleep problems.  Having trouble walking or getting yourself around (reduced mobility).  Injuring yourself in a fall as you get older. What steps can I take to be more physically active?   Check with your health care provider  about how to get started. Ask your health care provider what activities are safe for you.  Start out slowly. Walking or doing some simple chair exercises is a good place to start, especially if you have not been active before or for a long time.  Try to find activities that you enjoy. You are more likely to commit to an exercise routine if it does not feel like a chore.  If you have bone or joint problems, choose low-impact exercises, like walking or swimming.  Include physical activity in your everyday routine.  Invite friends or family members to exercise with you. This also will help you commit to your workout plan.  Set goals that you can work toward.  Aim for at least 150 minutes of moderate-intensity exercise each week. Examples of moderate-intensity exercise include walking or riding a bike. Where to find more information  Centers for Disease Control and Prevention: JokeRule.co.ukwww.cdc.gov/physicalactivity/index.html  President's Council on Fitness, Sports & Nutrition www.http://smith-thompson.com/fitness.gov/resource-center  ChooseMyPlate: MissedFlights.com.brwww.choosemyplate.gov/physical-activity Contact a health care provider if:  You have headaches, muscle aches, or joint pain.  You feel dizzy or light-headed while exercising.  You faint.  You have chest pain while exercising. Summary  Exercise benefits your mind and body at any age, even if you are just starting out.  If you have a chronic illness or have not been active for a while, check with your  health care provider before increasing your physical activity.  Choose activities that are safe and enjoyable for you. Ask your health care provider what activities are safe for you.  Start slowly. Tell your health care provider if you have problems as you start to increase your activity level. This information is not intended to replace advice given to you by your health care provider. Make sure you discuss any questions you have with your health care provider. Document  Released: 03/20/2016 Document Revised: 07/26/2018 Document Reviewed: 03/20/2016 Elsevier Patient Education  2020 [employer].

## 2019-05-19 ENCOUNTER — Telehealth: Payer: Self-pay | Admitting: *Deleted

## 2019-05-19 NOTE — Telephone Encounter (Signed)
A message was left, re: her follow up visit with Dr.Crenshaw. 

## 2019-07-28 NOTE — Progress Notes (Signed)
HPI: Follow-up supraventricular tachycardia.  Patient seen in May 2019 with palpitations.  Echocardiogram June 2019 showed normal LV function, mild left ventricular hypertrophy, grade 1 diastolic dysfunction, mild left atrial enlargement.  Patient had an episode of documented supraventricular tachycardia June 2019.  She was referred to Dr. Ladona Ridgel for consideration of ablation but she preferred conservative measures at that time.  Since last seen she has had two or three episodes of SVT.  She takes an extra Toprol and these typically resolve.  Otherwise denies dyspnea or exertional chest pain.  No syncope.  Current Outpatient Medications  Medication Sig Dispense Refill  . Calcium Carbonate-Vitamin D (CALCIUM 500 + D) 500-125 MG-UNIT TABS Take 1 tablet by mouth daily.    Marland Kitchen ezetimibe (ZETIA) 10 MG tablet Take 10 mg by mouth daily.    Marland Kitchen loratadine (CLARITIN) 10 MG tablet Take 10 mg by mouth daily.    . metoprolol succinate (TOPROL-XL) 50 MG 24 hr tablet Take 1 tablet (50 mg total) by mouth 2 (two) times daily. Take with or immediately following a meal. 60 tablet 6  . Multiple Vitamins-Minerals (MULTIVITAMIN WITH MINERALS) tablet Take 1 tablet by mouth daily.    Marland Kitchen omeprazole (PRILOSEC) 40 MG capsule Take 40 mg by mouth every morning.     . sertraline (ZOLOFT) 100 MG tablet Take 100 mg by mouth daily.    . simvastatin (ZOCOR) 80 MG tablet Take 80 mg by mouth daily.    Marland Kitchen telmisartan (MICARDIS) 80 MG tablet Take 1 tablet (80 mg total) by mouth daily. 90 tablet 3   No current facility-administered medications for this visit.     Past Medical History:  Diagnosis Date  . Anxiety   . GERD (gastroesophageal reflux disease)   . Hypercholesterolemia   . Hypertension   . Multiple allergies   . Ulcerative colitis [facility])     Past Surgical History:  Procedure Laterality Date  . CESAREAN SECTION    . cholecystectomy    . COLONOSCOPY WITH PROPOFOL N/A 06/10/2016   Procedure: COLONOSCOPY WITH  PROPOFOL;  Surgeon: Charolett Bumpers, MD;  Location: [facility];  Service: Endoscopy;  Laterality: N/A;  . LEEP  10/2016    Social History   Socioeconomic History  . Marital status: Married    Spouse name: Not on file  . Number of children: 1  . Years of education: Not on file  . Highest education level: Not on file  Occupational History  . Occupation: SEDENTARY  Tobacco Use  . Smoking status: Never Smoker  . Smokeless tobacco: Never Used  Substance and Sexual Activity  . Alcohol use: Yes    Comment: Occasional  . Drug use: Never  . Sexual activity: Not on file  Other Topics Concern  . Not on file  Social History Narrative  . Not on file   Social Determinants of Health   Financial Resource Strain:   . Difficulty of Paying Living Expenses:   Food Insecurity:   . Worried About Programme researcher, broadcasting/film/video in the Last Year:   . Barista in the Last Year:   Transportation Needs:   . Freight forwarder (Medical):   Marland Kitchen Lack of Transportation (Non-Medical):   Physical Activity:   . Days of Exercise per Week:   . Minutes of Exercise per Session:   Stress:   . Feeling of Stress :   Social Connections:   . Frequency of Communication with Friends and Family:   .  Frequency of Social Gatherings with Friends and Family:   . Attends Religious Services:   . Active Member of Clubs or Organizations:   . Attends Archivist Meetings:   Marland Kitchen Marital Status:   Intimate Partner Violence:   . Fear of Current or Ex-Partner:   . Emotionally Abused:   Marland Kitchen Physically Abused:   . Sexually Abused:     Family History  Problem Relation Age of Onset  . Hyperlipidemia Mother   . Hypertension Father   . Atrial fibrillation Father   . Heart attack Maternal Grandmother   . Diabetes Mellitus I Maternal Grandfather   . CAD Paternal Grandfather   . Obesity Paternal Grandfather     ROS: no fevers or chills, productive cough, hemoptysis, dysphasia, odynophagia, melena, hematochezia,  dysuria, hematuria, rash, seizure activity, orthopnea, PND, pedal edema, claudication. Remaining systems are negative.  Physical Exam: Well-developed well-nourished in no acute distress.  Skin is warm and dry.  HEENT is normal.  Neck is supple.  Chest is clear to auscultation with normal expansion.  Cardiovascular exam is regular rate and rhythm.  Abdominal exam nontender or distended. No masses palpated. Extremities show no edema. neuro grossly intact  ECG-sinus rhythm at a rate of 80, nonspecific ST changes.  Personally reviewed  A/P  1 supraventricular tachycardia-patient doing well with beta-blockade therapy.  She has infrequent episodes and would prefer conservative measures at this time.  Will consider referral for ablation in the future if she has more frequent episodes and is agreeable.  2 hypertension-blood pressure elevated.  I will increase Toprol to 100 mg twice daily and follow.  3 hyperlipidemia-continue statin and Zetia.  Lipids and liver monitored by primary care.  Kirk Ruths, MD

## 2019-08-03 ENCOUNTER — Ambulatory Visit: Payer: BC Managed Care – PPO | Admitting: Cardiology

## 2019-08-03 ENCOUNTER — Other Ambulatory Visit: Payer: Self-pay

## 2019-08-03 ENCOUNTER — Encounter: Payer: Self-pay | Admitting: Cardiology

## 2019-08-03 VITALS — BP 142/86 | HR 80 | Ht 66.0 in | Wt 242.8 lb

## 2019-08-03 DIAGNOSIS — E7800 Pure hypercholesterolemia, unspecified: Secondary | ICD-10-CM

## 2019-08-03 DIAGNOSIS — I10 Essential (primary) hypertension: Secondary | ICD-10-CM | POA: Diagnosis not present

## 2019-08-03 DIAGNOSIS — I471 Supraventricular tachycardia: Secondary | ICD-10-CM

## 2019-08-03 MED ORDER — METOPROLOL SUCCINATE ER 100 MG PO TB24: 100.0000 mg | ORAL_TABLET | Freq: Two times a day (BID) | ORAL | 3 refills | Status: DC

## 2019-08-03 NOTE — Patient Instructions (Signed)
Medication Instructions:  INCREASE METOPROLOL SUCC ER TO 100 MG TWICE DAILY=2 OF THE 50 MG TABLETS TWICE DAILY  *If you need a refill on your cardiac medications before your next appointment, please call your pharmacy*   Lab Work: If you have labs (blood work) drawn today and your tests are completely normal, you will receive your results only by: Marland Kitchen MyChart Message (if you have MyChart) OR . A paper copy in the mail If you have any lab test that is abnormal or we need to change your treatment, we will call you to review the results.  Follow-Up: At [facility], you and your health needs are our priority.  As part of our continuing mission to provide you with exceptional heart care, we have created designated Provider Care Teams.  These Care Teams include your primary Cardiologist (physician) and Advanced Practice Providers (APPs -  Physician Assistants and Nurse Practitioners) who all work together to provide you with the care you need, when you need it.  We recommend signing up for the patient portal called "MyChart".  Sign up information is provided on this After Visit Summary.  MyChart is used to connect with patients for Virtual Visits (Telemedicine).  Patients are able to view lab/test results, encounter notes, upcoming appointments, etc.  Non-urgent messages can be sent to your provider as well.   To learn more about what you can do with MyChart, go to [email or web address].    Your next appointment:   12 month(s)  The format for your next appointment:   Either In Person or Virtual  Provider:   Olga Millers, MD

## 2019-12-14 DIAGNOSIS — Z8616 Personal history of COVID-19: Secondary | ICD-10-CM

## 2019-12-14 HISTORY — DX: Personal history of COVID-19: Z86.16

## 2020-08-06 NOTE — Progress Notes (Signed)
HPI: Follow-up supraventricular tachycardia.  Patient seen in May 2019 with palpitations.  Echocardiogram June 2019 showed normal LV function, mild left ventricular hypertrophy, grade 1 diastolic dysfunction, mild left atrial enlargement.  Patient had an episode of documented supraventricular tachycardia June 2019.  She was referred to Dr. Ladona Ridgel for consideration of ablation but she preferred conservative measures at that time.  Since last seen  she denies dyspnea on exertion, orthopnea, PND, pedal edema, chest pain or syncope.  She has a rare episodes of SVT that she terminates with Valsalva.  She has not had prolonged episodes.  Current Outpatient Medications  Medication Sig Dispense Refill  . amLODipine (NORVASC) 5 MG tablet Take 5 mg by mouth daily.    . Calcium Carbonate-Vitamin D 500-125 MG-UNIT TABS Take 1 tablet by mouth daily.    Marland Kitchen ezetimibe (ZETIA) 10 MG tablet Take 10 mg by mouth daily.    Marland Kitchen loratadine (CLARITIN) 10 MG tablet Take 10 mg by mouth daily.    . metoprolol succinate (TOPROL-XL) 100 MG 24 hr tablet Take 1 tablet (100 mg total) by mouth 2 (two) times daily. Take with or immediately following a meal. 180 tablet 3  . Multiple Vitamins-Minerals (MULTIVITAMIN WITH MINERALS) tablet Take 1 tablet by mouth daily.    Marland Kitchen omeprazole (PRILOSEC) 40 MG capsule Take 40 mg by mouth every morning.     . sertraline (ZOLOFT) 100 MG tablet Take 100 mg by mouth daily.    . simvastatin (ZOCOR) 80 MG tablet Take 80 mg by mouth daily.    Marland Kitchen telmisartan (MICARDIS) 80 MG tablet Take 1 tablet (80 mg total) by mouth daily. 90 tablet 3   No current facility-administered medications for this visit.     Past Medical History:  Diagnosis Date  . Anxiety   . GERD (gastroesophageal reflux disease)   . Hypercholesterolemia   . Hypertension   . Multiple allergies   . Ulcerative colitis [facility])     Past Surgical History:  Procedure Laterality Date  . CESAREAN SECTION    . cholecystectomy    .  COLONOSCOPY WITH PROPOFOL N/A 06/10/2016   Procedure: COLONOSCOPY WITH PROPOFOL;  Surgeon: Charolett Bumpers, MD;  Location: [facility];  Service: Endoscopy;  Laterality: N/A;  . LEEP  10/2016    Social History   Socioeconomic History  . Marital status: Married    Spouse name: Not on file  . Number of children: 1  . Years of education: Not on file  . Highest education level: Not on file  Occupational History  . Occupation: SEDENTARY  Tobacco Use  . Smoking status: Never Smoker  . Smokeless tobacco: Never Used  Vaping Use  . Vaping Use: Never used  Substance and Sexual Activity  . Alcohol use: Yes    Comment: Occasional  . Drug use: Never  . Sexual activity: Not on file  Other Topics Concern  . Not on file  Social History Narrative  . Not on file   Social Determinants of Health   Financial Resource Strain: Not on file  Food Insecurity: Not on file  Transportation Needs: Not on file  Physical Activity: Not on file  Stress: Not on file  Social Connections: Not on file  Intimate Partner Violence: Not on file    Family History  Problem Relation Age of Onset  . Hyperlipidemia Mother   . Hypertension Father   . Atrial fibrillation Father   . Heart attack Maternal Grandmother   . Diabetes  Mellitus I Maternal Grandfather   . CAD Paternal Grandfather   . Obesity Paternal Grandfather     ROS: no fevers or chills, productive cough, hemoptysis, dysphasia, odynophagia, melena, hematochezia, dysuria, hematuria, rash, seizure activity, orthopnea, PND, pedal edema, claudication. Remaining systems are negative.  Physical Exam: Well-developed well-nourished in no acute distress.  Skin is warm and dry.  HEENT is normal.  Neck is supple.  Chest is clear to auscultation with normal expansion.  Cardiovascular exam is regular rate and rhythm.  Abdominal exam nontender or distended. No masses palpated. Extremities show no edema. neuro grossly intact  ECG-normal sinus rhythm at  a rate of 69, first-degree AV block, no ST changes.  Personally reviewed  A/P  1 SVT-patient continues to have infrequent episodes and would prefer conservative management.  Continue beta-blocker.  Will consider referral for ablation in the future if she has more frequent episodes.  2 hypertension-blood pressure controlled.  Continue present medications and follow.  Potassium and renal function monitored by primary care.  3 hyperlipidemia-continue present medications.  Lipids and liver monitored by primary care.  4 snoring-we will refer to pulmonary for evaluation for possible sleep apnea.  Olga Millers, MD

## 2020-08-15 ENCOUNTER — Encounter: Payer: Self-pay | Admitting: Cardiology

## 2020-08-15 ENCOUNTER — Ambulatory Visit: Payer: BC Managed Care – PPO | Admitting: Cardiology

## 2020-08-15 ENCOUNTER — Other Ambulatory Visit: Payer: Self-pay

## 2020-08-15 VITALS — BP 128/86 | HR 69 | Ht 66.0 in | Wt 241.0 lb

## 2020-08-15 DIAGNOSIS — E7800 Pure hypercholesterolemia, unspecified: Secondary | ICD-10-CM | POA: Diagnosis not present

## 2020-08-15 DIAGNOSIS — I10 Essential (primary) hypertension: Secondary | ICD-10-CM | POA: Diagnosis not present

## 2020-08-15 DIAGNOSIS — I471 Supraventricular tachycardia: Secondary | ICD-10-CM | POA: Diagnosis not present

## 2020-08-15 DIAGNOSIS — R0683 Snoring: Secondary | ICD-10-CM | POA: Diagnosis not present

## 2020-08-15 NOTE — Patient Instructions (Signed)

## 2020-09-26 NOTE — Progress Notes (Signed)
09/27/20- 50 yo F never smoker for sleep evaluation courtesy of Dr Jens Som with concern of snoring. Medical problem list includes PSVT, HTN, Ulcerative Colitis, GERD, Obesity, Hyperlipidemia, Anxiety Epworth score-8 Body weight today-244 lbs Covid vax- 3 Phizer -----Snoring, daytime fatigue for past 5 years.  Aware of loud snoring. Not told of witnessed apnea. Admits some daytime tiredness. No ENT surgery or lung disease. No sleep meds. 1 cup coffee, 1 soda/ day. Brother and Father on CPAP.   Prior to Admission medications   Medication Sig Start Date End Date Taking? Authorizing Provider  amLODipine (NORVASC) 5 MG tablet Take 5 mg by mouth daily. 07/18/20  Yes [provider]  Calcium Carbonate-Vitamin D 500-125 MG-UNIT TABS Take 1 tablet by mouth daily.   Yes [provider]  ezetimibe (ZETIA) 10 MG tablet Take 10 mg by mouth daily.   Yes [provider]  loratadine (CLARITIN) 10 MG tablet Take 10 mg by mouth daily.   Yes [provider]  metoprolol succinate (TOPROL-XL) 100 MG 24 hr tablet Take 1 tablet (100 mg total) by mouth 2 (two) times daily. Take with or immediately following a meal. 08/03/19  Yes Crenshaw, Madolyn Frieze, MD  Multiple Vitamins-Minerals (MULTIVITAMIN WITH MINERALS) tablet Take 1 tablet by mouth daily.   Yes [provider]  omeprazole (PRILOSEC) 40 MG capsule Take 40 mg by mouth every morning.    Yes [provider]  sertraline (ZOLOFT) 100 MG tablet Take 100 mg by mouth daily.   Yes [provider]  simvastatin (ZOCOR) 80 MG tablet Take 80 mg by mouth daily.   Yes [provider]  telmisartan (MICARDIS) 80 MG tablet Take 1 tablet (80 mg total) by mouth daily. 11/17/18  Yes Abelino Derrick, PA-C   Past Medical History:  Diagnosis Date   Anxiety    GERD (gastroesophageal reflux disease)    Hypercholesterolemia    Hypertension    Multiple allergies    SVT (supraventricular tachycardia) (HCC)    Ulcerative  colitis (HCC)    Past Surgical History:  Procedure Laterality Date   CESAREAN SECTION     cholecystectomy     COLONOSCOPY WITH PROPOFOL N/A 06/10/2016   Procedure: COLONOSCOPY WITH PROPOFOL;  Surgeon: Charolett Bumpers, MD;  Location: [facility];  Service: Endoscopy;  Laterality: N/A;   LEEP  10/2016   Family History  Problem Relation Age of Onset   Hyperlipidemia Mother    Hypertension Father    Atrial fibrillation Father    Heart attack Maternal Grandmother    Diabetes Mellitus I Maternal Grandfather    CAD Paternal Grandfather    Obesity Paternal Grandfather    Social History   Socioeconomic History   Marital status: Married    Spouse name: Not on file   Number of children: 1   Years of education: Not on file   Highest education level: Not on file  Occupational History   Occupation: SEDENTARY  Tobacco Use   Smoking status: Never   Smokeless tobacco: Never  Vaping Use   Vaping Use: Never used  Substance and Sexual Activity   Alcohol use: Yes    Comment: Occasional   Drug use: Never   Sexual activity: Not on file  Other Topics Concern   Not on file  Social History Narrative   Lives with husband and son.    Social Determinants of Health   Financial Resource Strain: Not on file  Food Insecurity: Not on file  Transportation Needs: Not on  file  Physical Activity: Not on file  Stress: Not on file  Social Connections: Not on file  Intimate Partner Violence: Not on file   ROS-see HPI   + = positive Constitutional:    weight loss, night sweats, fevers, chills, fatigue, lassitude. HEENT:    headaches, difficulty swallowing, tooth/dental problems, sore throat,       sneezing, itching, ear ache, nasal congestion, post nasal drip, snoring CV:    chest pain, orthopnea, PND, swelling in lower extremities, anasarca,                                   dizziness, palpitations Resp:   shortness of breath with exertion or at rest.                productive cough,    non-productive cough, coughing up of blood.              change in color of mucus.  wheezing.   Skin:    rash or lesions. GI:  No-   heartburn, indigestion, abdominal pain, nausea, vomiting, diarrhea,                 change in bowel habits, loss of appetite GU: dysuria, change in color of urine, no urgency or frequency.   flank pain. MS:   joint pain, stiffness, decreased range of motion, back pain. Neuro-     nothing unusual Psych:  change in mood or affect.  depression or anxiety.   memory loss.  OBJ- Physical Exam General- Alert, Oriented, Affect-appropriate, Distress- none acute, + obese Skin- rash-none, lesions- none, excoriation- none Lymphadenopathy- none Head- atraumatic            Eyes- Gross vision intact, PERRLA, conjunctivae and secretions clear            Ears- Hearing, canals-normal            Nose- Clear, no-Septal dev, mucus, polyps, erosion, perforation             Throat- Mallampati III , mucosa clear , drainage- none, tonsils- atrophic, + teeth Neck- flexible , trachea midline, no stridor , thyroid nl, carotid no bruit Chest - symmetrical excursion , unlabored           Heart/CV- RRR , no murmur , no gallop  , no rub, nl s1 s2                           - JVD- none , edema- none, stasis changes- none, varices- none           Lung- +crackles L base, wheeze- none, cough- none , dullness-none, rub- none           Chest wall-  Abd-  Br/ Gen/ Rectal- Not done, not indicated Extrem- cyanosis- none, clubbing, none, atrophy- none, strength- nl Neuro- grossly intact to observation

## 2020-09-27 ENCOUNTER — Other Ambulatory Visit: Payer: Self-pay

## 2020-09-27 ENCOUNTER — Ambulatory Visit: Payer: BC Managed Care – PPO | Admitting: Internal Medicine

## 2020-09-27 ENCOUNTER — Encounter: Payer: Self-pay | Admitting: Internal Medicine

## 2020-09-27 VITALS — BP 116/72 | HR 77 | Temp 97.7°F | Ht 66.0 in | Wt 244.0 lb

## 2020-09-27 DIAGNOSIS — I471 Supraventricular tachycardia: Secondary | ICD-10-CM | POA: Diagnosis not present

## 2020-09-27 DIAGNOSIS — R0683 Snoring: Secondary | ICD-10-CM | POA: Diagnosis not present

## 2020-09-27 NOTE — Patient Instructions (Signed)
Order- schedule home sleep test     dx  Snoring  Please call us for results and recommendations about 2 weeks after your study.

## 2020-09-29 ENCOUNTER — Encounter: Payer: Self-pay | Admitting: Internal Medicine

## 2020-09-29 DIAGNOSIS — R0683 Snoring: Secondary | ICD-10-CM | POA: Insufficient documentation

## 2020-09-29 NOTE — Assessment & Plan Note (Signed)
Concern is possible OSA with potential cardiac implications. Appropriate discussions done. Plan- sleep study

## 2020-09-29 NOTE — Assessment & Plan Note (Signed)
Rhythm regular c/w RSR at this visit. Followed by cardiology.

## 2020-10-04 ENCOUNTER — Institutional Professional Consult (permissible substitution): Payer: BC Managed Care – PPO | Admitting: Internal Medicine

## 2020-11-05 ENCOUNTER — Ambulatory Visit: Payer: BC Managed Care – PPO

## 2020-11-05 ENCOUNTER — Other Ambulatory Visit: Payer: Self-pay

## 2020-11-05 DIAGNOSIS — G4733 Obstructive sleep apnea (adult) (pediatric): Secondary | ICD-10-CM | POA: Diagnosis not present

## 2020-11-05 DIAGNOSIS — R0683 Snoring: Secondary | ICD-10-CM

## 2020-11-19 DIAGNOSIS — G4733 Obstructive sleep apnea (adult) (pediatric): Secondary | ICD-10-CM | POA: Diagnosis not present

## 2020-12-05 ENCOUNTER — Other Ambulatory Visit: Payer: Self-pay | Admitting: Family Medicine

## 2020-12-05 DIAGNOSIS — Z1231 Encounter for screening mammogram for malignant neoplasm of breast: Secondary | ICD-10-CM

## 2020-12-28 ENCOUNTER — Ambulatory Visit: Payer: BC Managed Care – PPO

## 2021-01-25 NOTE — Progress Notes (Signed)
09/27/20- 50 yo F never smoker for sleep evaluation courtesy of Dr Jens Som with concern of snoring. Medical problem list includes PSVT, HTN, Ulcerative Colitis, GERD, Obesity, Hyperlipidemia, Anxiety Epworth score-8 Body weight today-244 lbs Covid vax- 3 Phizer -----Snoring, daytime fatigue for past 5 years.  Aware of loud snoring. Not told of witnessed apnea. Admits some daytime tiredness. No ENT surgery or lung disease. No sleep meds. 1 cup coffee, 1 soda/ day. Brother and Father on CPAP.   01/28/21- 50 yoF never smoker followed for OSA, complicated by PSVT, HTN, Ulcerative Colitis, GERD, Obesity, Hyperlipidemia, Anxiety HST 11/05/20- AHI 24.2/ hr, desaturation to 80%, body weight 244 lbs Needs PAP referral Body weight today-250 lbs Covid vax- 3 Phizer Flu vax- today -----Follow up on at home sleep study. We reviewed her sleep study results, symptoms, and treatment options.  She did not make a firm choice at this visit so we are going to help her look deeper into both CPAP and a fitted oral appliance.  She will decide which way she wants to go.   ROS-see HPI   + = positive Constitutional:    weight loss, night sweats, fevers, chills, fatigue, lassitude. HEENT:    headaches, difficulty swallowing, tooth/dental problems, sore throat,       sneezing, itching, ear ache, nasal congestion, post nasal drip, snoring CV:    chest pain, orthopnea, PND, swelling in lower extremities, anasarca,                                   dizziness, palpitations Resp:   shortness of breath with exertion or at rest.                productive cough,   non-productive cough, coughing up of blood.              change in color of mucus.  wheezing.   Skin:    rash or lesions. GI:  No-   heartburn, indigestion, abdominal pain, nausea, vomiting, diarrhea,                 change in bowel habits, loss of appetite GU: dysuria, change in color of urine, no urgency or frequency.   flank pain. MS:   joint pain,  stiffness, decreased range of motion, back pain. Neuro-     nothing unusual Psych:  change in mood or affect.  depression or anxiety.   memory loss.  OBJ- Physical Exam General- Alert, Oriented, Affect-appropriate, Distress- none acute, + obese Skin- rash-none, lesions- none, excoriation- none Lymphadenopathy- none Head- atraumatic            Eyes- Gross vision intact, PERRLA, conjunctivae and secretions clear            Ears- Hearing, canals-normal            Nose- Clear, no-Septal dev, mucus, polyps, erosion, perforation             Throat- Mallampati III , mucosa clear , drainage- none, tonsils- atrophic, + teeth Neck- flexible , trachea midline, no stridor , thyroid nl, carotid no bruit Chest - symmetrical excursion , unlabored           Heart/CV- RRR , no murmur , no gallop  , no rub, nl s1 s2                           -  JVD- none , edema- none, stasis changes- none, varices- none           Lung- +crackles L base, wheeze- none, cough- none , dullness-none, rub- none           Chest wall-  Abd-  Br/ Gen/ Rectal- Not done, not indicated Extrem- cyanosis- none, clubbing, none, atrophy- none, strength- nl Neuro- grossly intact to observation

## 2021-01-28 ENCOUNTER — Ambulatory Visit: Payer: BC Managed Care – PPO | Admitting: Internal Medicine

## 2021-01-28 ENCOUNTER — Ambulatory Visit
Admission: RE | Admit: 2021-01-28 | Discharge: 2021-01-28 | Disposition: A | Discharge status: Home / Self Care | Payer: BC Managed Care – PPO | Source: Ambulatory Visit | Attending: Family Medicine | Admitting: Family Medicine

## 2021-01-28 ENCOUNTER — Other Ambulatory Visit: Payer: Self-pay

## 2021-01-28 ENCOUNTER — Encounter: Payer: Self-pay | Admitting: Internal Medicine

## 2021-01-28 VITALS — BP 122/82 | HR 75 | Temp 98.0°F | Ht 66.0 in | Wt 250.0 lb

## 2021-01-28 DIAGNOSIS — Z23 Encounter for immunization: Secondary | ICD-10-CM | POA: Diagnosis not present

## 2021-01-28 DIAGNOSIS — E669 Obesity, unspecified: Secondary | ICD-10-CM

## 2021-01-28 DIAGNOSIS — G4733 Obstructive sleep apnea (adult) (pediatric): Secondary | ICD-10-CM

## 2021-01-28 DIAGNOSIS — Z1231 Encounter for screening mammogram for malignant neoplasm of breast: Secondary | ICD-10-CM

## 2021-01-28 NOTE — Patient Instructions (Signed)
Order- Flu vax- standard  Order- refer to new DME, new CPAP auto 5-15, mask of choice, humidifier, supplies, AirView/ card  Order- refer to Orthodontist Dr Althea Grimmer    consider oral appliance for OSA  Please call if we can help

## 2021-02-04 ENCOUNTER — Other Ambulatory Visit: Payer: Self-pay | Admitting: Family Medicine

## 2021-02-04 DIAGNOSIS — R928 Other abnormal and inconclusive findings on diagnostic imaging of breast: Secondary | ICD-10-CM

## 2021-02-20 ENCOUNTER — Ambulatory Visit
Admission: RE | Admit: 2021-02-20 | Discharge: 2021-02-20 | Disposition: A | Discharge status: Home / Self Care | Payer: BC Managed Care – PPO | Source: Ambulatory Visit | Attending: Family Medicine | Admitting: Family Medicine

## 2021-02-20 ENCOUNTER — Other Ambulatory Visit: Payer: Self-pay

## 2021-02-20 ENCOUNTER — Other Ambulatory Visit: Payer: Self-pay | Admitting: Family Medicine

## 2021-02-20 DIAGNOSIS — R928 Other abnormal and inconclusive findings on diagnostic imaging of breast: Secondary | ICD-10-CM

## 2021-03-22 ENCOUNTER — Other Ambulatory Visit: Payer: Self-pay

## 2021-03-22 ENCOUNTER — Encounter (HOSPITAL_BASED_OUTPATIENT_CLINIC_OR_DEPARTMENT_OTHER): Payer: Self-pay | Admitting: Obstetrics and Gynecology

## 2021-03-22 NOTE — Progress Notes (Signed)
Spoke w/ via phone for pre-op interview--- pt Lab needs dos----  itstat, urine pre, t&s             Lab results------ current ekg in epic/ chart COVID test -----patient states asymptomatic no test needed Arrive at ------- 0630 on 03-27-2021 NPO after MN NO Solid Food.  Clear liquids from MN until--- 0530 Med rec completed Medications to take morning of surgery ----- claritin, toprol, norvasc, prilosec Diabetic medication ----- n/a Patient instructed no nail polish to be worn day of surgery Patient instructed to bring photo id and insurance card day of surgery Patient aware to have Driver (ride ) / caregiver  for 24 hours after surgery --friend, misty danley Patient Special Instructions ----- n/a Pre-Op special Istructions ----- n/a Patient verbalized understanding of instructions that were given at this phone interview. Patient denies shortness of breath, chest pain, fever, cough at this phone interview.    Anesthesia Review:  HTN;  PSVT;  OSA new dx 07/ 2022 pt waiting on cpap to come in Pt denies cardiac symptoms, sob, and no peripheral swelling  PCP:  Dr Manus Gunning Cardiologist : Dr Jens Som [facility] 05-04-20223 epic) EKG :  08-15-2020 epic Echo : 09-25-2017  epic Stress test: no Sleep Study/ CPAP :  Yes/  waiting on cpap Blood Thinner/ Instructions /Last Dose: no ASA / Instructions/ Last Dose :  no

## 2021-03-26 NOTE — H&P (Signed)
Carrie Savage is an 50 y.o. female P1 with AUB.   9/21 visit "Heavier and cramping more than usual for the last 4-5 months. Had VB most of August. Restarted 9/14, heavy flow and cramping, still having spotting now. On heaviest day, has to change super size tampon or maxi pad 3 times per day.  Has been off OCPs for a few years."  Hgb 13.0, TSH 1.09  10/27 scan: 6.5cm anteverted uterus with thickened luteal phase lining, 21mm, with internal vascularity suggesting presence of polyp, normal right ovary, left ovary not visualized  Patient's last menstrual period was 03/13/2021 (approximate).    Past Medical History:  Diagnosis Date   Allergic rhinitis    First degree heart block    GAD (generalized anxiety disorder)    GERD (gastroesophageal reflux disease)    History of abnormal cervical Pap smear 10/2016   s/p  LEEP in gyn office   History of COVID-19 12/2019   per pt mild symptoms that resolved   History of ulcerative colitis    per pt dx age 65, was on medication until early age 15s remission since;  followed by dr Laural Benes (GI)   Hyperlipidemia    Hypertension    Menorrhagia    OSA (obstructive sleep apnea)    followed by dr c. young---  sleep study in epic 11-05-2020, moderate OSA per pt  cpap on back order   Paroxysmal SVT (supraventricular tachycardia) (HCC) 09/2017   cardiologist-- dr Jens Som,  takes toprol   Wears contact lenses     Past Surgical History:  Procedure Laterality Date   CESAREAN SECTION  10/02/2005   @[facility]    CHOLECYSTECTOMY, LAPAROSCOPIC  03/29/2009   @[facility]    COLONOSCOPY WITH PROPOFOL N/A 06/10/2016   Procedure: COLONOSCOPY WITH PROPOFOL;  Surgeon: , MD;  Location: [facility];  Service: Endoscopy;  Laterality: N/A;    Family History  Problem Relation Age of Onset   Hyperlipidemia Mother    Hypertension Father    Atrial fibrillation Father    Heart attack Maternal Grandmother    Diabetes Mellitus I Maternal Grandfather    CAD Paternal  Grandfather    Obesity Paternal Grandfather     Social History:  reports that she has never smoked. She has never used smokeless tobacco. She reports current alcohol use. She reports that she does not use drugs.  Allergies:  Allergies  Allergen Reactions   Sulfa Antibiotics Hives and Itching   Crestor [Rosuvastatin] Other (See Comments)    ELEVATED LFT's   Lipitor [Atorvastatin Calcium] Other (See Comments)    ELEVATED LFT's   Lisinopril Diarrhea and Cough   Pravastatin Other (See Comments)    INEFFECTIVE 2011    Medications Prior to Admission  Medication Sig Dispense Refill Last Dose   amLODipine (NORVASC) 5 MG tablet Take 5 mg by mouth daily.   03/27/2021 at 0500   Calcium Carbonate-Vitamin D 500-125 MG-UNIT TABS Take 1 tablet by mouth daily.   Past Week   dextromethorphan-guaiFENesin (MUCINEX DM) 30-600 MG 12hr tablet Take 1 tablet by mouth 2 (two) times daily as needed for cough.   03/26/2021   ezetimibe (ZETIA) 10 MG tablet Take 10 mg by mouth at bedtime.   03/26/2021   loratadine (CLARITIN) 10 MG tablet Take 10 mg by mouth daily.   03/27/2021 at 0500   metoprolol succinate (TOPROL-XL) 100 MG 24 hr tablet Take 1 tablet (100 mg total) by mouth 2 (two) times daily. Take with or immediately following a  meal. (Patient taking differently: Take 100 mg by mouth 2 (two) times daily. Take with or immediately following a meal.) 180 tablet 3 03/27/2021 at 0500   Multiple Vitamins-Minerals (MULTIVITAMIN WITH MINERALS) tablet Take 1 tablet by mouth daily.   03/27/2021 at 0500   omeprazole (PRILOSEC) 40 MG capsule Take 40 mg by mouth 2 (two) times daily.   03/27/2021 at 0500   sertraline (ZOLOFT) 100 MG tablet Take 100 mg by mouth at bedtime.   03/26/2021   simvastatin (ZOCOR) 80 MG tablet Take 80 mg by mouth at bedtime.   03/26/2021   telmisartan (MICARDIS) 80 MG tablet Take 1 tablet (80 mg total) by mouth daily. (Patient taking differently: Take 80 mg by mouth daily.) 90 tablet 3 03/26/2021    ALPRAZolam (XANAX) 0.5 MG tablet Take 0.5 mg by mouth 3 (three) times daily as needed for anxiety.   More than a month    Review of Systems  Constitutional:  Negative for fever.  HENT:  Negative for sore throat.   Eyes:  Negative for pain.  Respiratory:  Negative for cough and shortness of breath.   Cardiovascular:  Negative for chest pain.  Gastrointestinal:  Negative for abdominal pain.  Genitourinary:  Positive for menstrual problem.  Musculoskeletal:  Negative for neck pain.  Skin:  Negative for rash.  Neurological:  Negative for headaches.  Psychiatric/Behavioral:  Negative for suicidal ideas.    Blood pressure 133/70, pulse 81, temperature 99 F (37.2 C), temperature source Oral, resp. rate 17, height 5' 6.5" (1.689 m), weight 112.3 kg, last menstrual period 03/13/2021, SpO2 96 %. Physical Exam  01/02/21 Chaperone Chaperone: present  Constitutional *General Appearance: healthy-appearing, well-nourished, well-developed  Head Head: normocephalic  Neck *Thyroid: no enlargement, no nodules, non-tender  Lymph Nodes *Palpation: non-tender supraclavicular nodes, non-tender axillary nodes, non-tender inguinal nodes  Cardiovascular *Auscultation: RRR, no murmur  Lungs *Respiratory Effort: no accessory muscle usage, no intercostal retractions *Auscultation: clear to auscultation, no wheezing, no rales/crackles, no rhonchi  *Breast Bilateral: no skin changes, nipple appearance: normal, no abnormal nipple secretions, no tenderness, no masses palpable  Abdomen *Inspection/Palpation/Auscultation: non-distended, no tenderness, no rebound, no guarding, soft  Female Genitalia Vulva: no masses, no atrophy, no lesions Mons: normal Labia Majora: normal, no erythema, no excoriation, no atrophy, no discoloration, no lesions Labia Minora: normal, no erythema, no excoriation, no atrophy, no discoloration, no lesions Introitus: normal *Vagina: normal, no discharge, no blood  present, no erythema, no atrophy, no lesions, no ulcers, no masses, no tenderness *Cervix: grossly normal, no lesions, no discharge, no bleeding, no cervical motion tenderness *Uterus: normal size, normal contour, midline, mobile, non-tender, anteverted *Urethral Meatus/ Urethra: normal meatus *Adnexa/Parametria: no mass palpable, no tenderness  Extremities Legs: no calf tenderness  Neurological System Impressions: motor: no deficits, sensory: no deficits  Psychiatric *Orientation: to person, to place, to time *Mood and Affect: active and alert, normal mood, normal affect Results for orders placed or performed during the hospital encounter of 03/27/21 (from the past 24 hour(s))  Pregnancy, urine POC     Status: None   Collection Time: 03/27/21  6:43 AM  Result Value Ref Range   Preg Test, Ur NEGATIVE NEGATIVE  Type and screen [facility]     Status: None (Preliminary result)   Collection Time: 03/27/21  7:10 AM  Result Value Ref Range   ABO/RH(D) PENDING    Antibody Screen PENDING    Sample Expiration      03/30/2021,2359 Performed at [facility],  [place] [address] [place], [place] [address]   I-STAT, [place] [address]     Status: Abnormal   Collection Time: 03/27/21  7:21 AM  Result Value Ref Range   Sodium 138 135 - 145 mmol/L   Potassium 3.7 3.5 - 5.1 mmol/L   Chloride 101 98 - 111 mmol/L   BUN 9 6 - 20 mg/dL   Creatinine, Ser 0.70 0.44 - 1.00 mg/dL   Glucose, Bld 108 (H) 70 - 99 mg/dL   Calcium, Ion 1.18 1.15 - 1.40 mmol/L   TCO2 26 22 - 32 mmol/L   Hemoglobin 13.6 12.0 - 15.0 g/dL   HCT 40.0 36.0 - 46.0 %    No results found.  Assessment/Plan: 50Y P1 with AUB, suspected endometrial polyp - Plan: exam under anesthesia, hysteroscopy, dilation and curettage, possible polypectomy - Risks reviewed including infection, bleeding, uterine perforation, need for laparoscopy, failure to achieve desired results. All questions answered. Informed consent  signed.    Rowland Lathe 03/27/2021, 8:22 AM

## 2021-03-27 ENCOUNTER — Ambulatory Visit (HOSPITAL_BASED_OUTPATIENT_CLINIC_OR_DEPARTMENT_OTHER)
Admission: RE | Admit: 2021-03-27 | Discharge: 2021-03-27 | Disposition: A | Discharge status: Home / Self Care | Payer: BC Managed Care – PPO | Source: Ambulatory Visit | Attending: Obstetrics and Gynecology | Admitting: Obstetrics and Gynecology

## 2021-03-27 ENCOUNTER — Encounter (HOSPITAL_BASED_OUTPATIENT_CLINIC_OR_DEPARTMENT_OTHER)
Admission: RE | Disposition: A | Discharge status: Home / Self Care | Payer: Self-pay | Source: Ambulatory Visit | Attending: Obstetrics and Gynecology

## 2021-03-27 ENCOUNTER — Ambulatory Visit (HOSPITAL_BASED_OUTPATIENT_CLINIC_OR_DEPARTMENT_OTHER): Payer: BC Managed Care – PPO | Admitting: Certified Registered"

## 2021-03-27 ENCOUNTER — Encounter (HOSPITAL_BASED_OUTPATIENT_CLINIC_OR_DEPARTMENT_OTHER): Payer: Self-pay | Admitting: Obstetrics and Gynecology

## 2021-03-27 DIAGNOSIS — N920 Excessive and frequent menstruation with regular cycle: Secondary | ICD-10-CM | POA: Insufficient documentation

## 2021-03-27 DIAGNOSIS — K219 Gastro-esophageal reflux disease without esophagitis: Secondary | ICD-10-CM | POA: Diagnosis not present

## 2021-03-27 DIAGNOSIS — G4733 Obstructive sleep apnea (adult) (pediatric): Secondary | ICD-10-CM | POA: Insufficient documentation

## 2021-03-27 DIAGNOSIS — Z01818 Encounter for other preprocedural examination: Secondary | ICD-10-CM

## 2021-03-27 DIAGNOSIS — I10 Essential (primary) hypertension: Secondary | ICD-10-CM | POA: Diagnosis not present

## 2021-03-27 DIAGNOSIS — N840 Polyp of corpus uteri: Secondary | ICD-10-CM | POA: Insufficient documentation

## 2021-03-27 HISTORY — DX: Atrioventricular block, first degree: I44.0

## 2021-03-27 HISTORY — PX: DILATATION & CURETTAGE/HYSTEROSCOPY WITH MYOSURE: SHX6511

## 2021-03-27 HISTORY — DX: Presence of spectacles and contact lenses: Z97.3

## 2021-03-27 HISTORY — DX: Obstructive sleep apnea (adult) (pediatric): G47.33

## 2021-03-27 HISTORY — DX: Allergic rhinitis, unspecified: J30.9

## 2021-03-27 HISTORY — DX: Excessive and frequent menstruation with regular cycle: N92.0

## 2021-03-27 HISTORY — DX: Hyperlipidemia, unspecified: E78.5

## 2021-03-27 HISTORY — DX: Personal history of other diseases of the digestive system: Z87.19

## 2021-03-27 HISTORY — DX: Generalized anxiety disorder: F41.1

## 2021-03-27 LAB — POCT I-STAT, CHEM 8
BUN: 9 mg/dL (ref 6–20)
Calcium, Ion: 1.18 mmol/L (ref 1.15–1.40)
Chloride: 101 mmol/L (ref 98–111)
Creatinine, Ser: 0.7 mg/dL (ref 0.44–1.00)
Glucose, Bld: 108 mg/dL — ABNORMAL HIGH (ref 70–99)
HCT: 40 % (ref 36.0–46.0)
Hemoglobin: 13.6 g/dL (ref 12.0–15.0)
Potassium: 3.7 mmol/L (ref 3.5–5.1)
Sodium: 138 mmol/L (ref 135–145)
TCO2: 26 mmol/L (ref 22–32)

## 2021-03-27 LAB — TYPE AND SCREEN
ABO/RH(D): A POS
Antibody Screen: NEGATIVE

## 2021-03-27 LAB — POCT PREGNANCY, URINE: Preg Test, Ur: NEGATIVE

## 2021-03-27 LAB — ABO/RH: ABO/RH(D): A POS

## 2021-03-27 SURGERY — DILATATION & CURETTAGE/HYSTEROSCOPY WITH MYOSURE
Anesthesia: General | Site: Vagina

## 2021-03-27 MED ORDER — ONDANSETRON HCL 4 MG/2ML IJ SOLN
INTRAMUSCULAR | Status: DC | PRN
  Administered 2021-03-27: 4 mg via INTRAVENOUS

## 2021-03-27 MED ORDER — LIDOCAINE 2% (20 MG/ML) 5 ML SYRINGE
INTRAMUSCULAR | Status: DC | PRN
  Administered 2021-03-27: 100 mg via INTRAVENOUS

## 2021-03-27 MED ORDER — SILVER NITRATE-POT NITRATE 75-25 % EX MISC
CUTANEOUS | Status: DC | PRN
  Administered 2021-03-27: 1

## 2021-03-27 MED ORDER — FENTANYL CITRATE (PF) 100 MCG/2ML IJ SOLN: 25.0000 ug | INTRAMUSCULAR | Status: DC | PRN

## 2021-03-27 MED ORDER — DEXAMETHASONE SODIUM PHOSPHATE 10 MG/ML IJ SOLN
INTRAMUSCULAR | Status: DC | PRN
  Administered 2021-03-27: 5 mg via INTRAVENOUS

## 2021-03-27 MED ORDER — ACETAMINOPHEN 500 MG PO TABS
ORAL_TABLET | ORAL | Status: AC
  Filled 2021-03-27: qty 2

## 2021-03-27 MED ORDER — LACTATED RINGERS IV SOLN: INTRAVENOUS | Status: DC

## 2021-03-27 MED ORDER — SOD CITRATE-CITRIC ACID 500-334 MG/5ML PO SOLN: 30.0000 mL | ORAL | Status: DC

## 2021-03-27 MED ORDER — PROPOFOL 10 MG/ML IV BOLUS
INTRAVENOUS | Status: DC | PRN
  Administered 2021-03-27: 200 mg via INTRAVENOUS

## 2021-03-27 MED ORDER — MIDAZOLAM HCL 2 MG/2ML IJ SOLN
INTRAMUSCULAR | Status: AC
  Filled 2021-03-27: qty 2

## 2021-03-27 MED ORDER — IBUPROFEN 200 MG PO TABS: 600.0000 mg | ORAL_TABLET | Freq: Four times a day (QID) | ORAL | Status: DC | PRN

## 2021-03-27 MED ORDER — KETOROLAC TROMETHAMINE 30 MG/ML IJ SOLN
INTRAMUSCULAR | Status: AC
  Filled 2021-03-27: qty 1

## 2021-03-27 MED ORDER — FENTANYL CITRATE (PF) 100 MCG/2ML IJ SOLN
INTRAMUSCULAR | Status: AC
  Filled 2021-03-27: qty 2

## 2021-03-27 MED ORDER — WHITE PETROLATUM EX OINT
TOPICAL_OINTMENT | CUTANEOUS | Status: AC
  Filled 2021-03-27: qty 5

## 2021-03-27 MED ORDER — PROPOFOL 10 MG/ML IV BOLUS
INTRAVENOUS | Status: AC
  Filled 2021-03-27: qty 20

## 2021-03-27 MED ORDER — FENTANYL CITRATE (PF) 100 MCG/2ML IJ SOLN
INTRAMUSCULAR | Status: DC | PRN
  Administered 2021-03-27: 47 ug via INTRAVENOUS
  Administered 2021-03-27: 25 ug via INTRAVENOUS
  Administered 2021-03-27: 50 ug via INTRAVENOUS

## 2021-03-27 MED ORDER — KETOROLAC TROMETHAMINE 30 MG/ML IJ SOLN
INTRAMUSCULAR | Status: DC | PRN
  Administered 2021-03-27: 30 mg via INTRAVENOUS

## 2021-03-27 MED ORDER — ACETAMINOPHEN 500 MG PO TABS
1000.0000 mg | ORAL_TABLET | ORAL | Status: AC
  Administered 2021-03-27: 07:00:00 1000 mg via ORAL

## 2021-03-27 MED ORDER — MIDAZOLAM HCL 2 MG/2ML IJ SOLN
INTRAMUSCULAR | Status: DC | PRN
  Administered 2021-03-27: 2 mg via INTRAVENOUS

## 2021-03-27 MED ORDER — KETOROLAC TROMETHAMINE 15 MG/ML IJ SOLN: 15.0000 mg | INTRAMUSCULAR | Status: DC

## 2021-03-27 MED ORDER — POVIDONE-IODINE 10 % EX SWAB: 2.0000 "application " | Freq: Once | CUTANEOUS | Status: DC

## 2021-03-27 MED ORDER — LIDOCAINE 2% (20 MG/ML) 5 ML SYRINGE
INTRAMUSCULAR | Status: AC
  Filled 2021-03-27: qty 5

## 2021-03-27 MED ORDER — DEXAMETHASONE SODIUM PHOSPHATE 10 MG/ML IJ SOLN
INTRAMUSCULAR | Status: AC
  Filled 2021-03-27: qty 1

## 2021-03-27 MED ORDER — SODIUM CHLORIDE 0.9 % IR SOLN
Status: DC | PRN
  Administered 2021-03-27: 3000 mL

## 2021-03-27 MED ORDER — LIDOCAINE HCL 1 % IJ SOLN
INTRAMUSCULAR | Status: DC | PRN
  Administered 2021-03-27: 10 mL

## 2021-03-27 MED ORDER — ONDANSETRON HCL 4 MG/2ML IJ SOLN
INTRAMUSCULAR | Status: AC
  Filled 2021-03-27: qty 2

## 2021-03-27 SURGICAL SUPPLY — 17 items
CATH ROBINSON RED A/P 16FR (CATHETERS) ×3 IMPLANT
DEVICE MYOSURE LITE (MISCELLANEOUS) IMPLANT
DEVICE MYOSURE REACH (MISCELLANEOUS) ×1 IMPLANT
GAUZE 4X4 16PLY ~~LOC~~+RFID DBL (SPONGE) ×3 IMPLANT
GLOVE SURG ENC MOIS LTX SZ6 (GLOVE) ×3 IMPLANT
GLOVE SURG NEOP MICRO LF SZ6.5 (GLOVE) ×1 IMPLANT
GLOVE SURG UNDER POLY LF SZ6.5 (GLOVE) ×3 IMPLANT
GOWN STRL REUS W/ TWL LRG LVL3 (GOWN DISPOSABLE) ×2 IMPLANT
GOWN STRL REUS W/TWL LRG LVL3 (GOWN DISPOSABLE) ×4
IV NS IRRIG 3000ML ARTHROMATIC (IV SOLUTION) ×1 IMPLANT
KIT PROCEDURE FLUENT (KITS) ×3 IMPLANT
KIT TURNOVER CYSTO (KITS) ×3 IMPLANT
PACK VAGINAL MINOR WOMEN LF (CUSTOM PROCEDURE TRAY) ×3 IMPLANT
PAD OB MATERNITY 4.3X12.25 (PERSONAL CARE ITEMS) ×3 IMPLANT
SEAL ROD LENS SCOPE MYOSURE (ABLATOR) ×3 IMPLANT
TOWEL OR 17X26 10 PK STRL BLUE (TOWEL DISPOSABLE) ×3 IMPLANT
UNDERPAD 30X36 HEAVY ABSORB (UNDERPADS AND DIAPERS) ×3 IMPLANT

## 2021-03-27 NOTE — Discharge Instructions (Addendum)
°  Post Anesthesia Home Care Instructions  Activity: Get plenty of rest for the remainder of the day. A responsible individual must stay with you for 24 hours following the procedure.  For the next 24 hours, DO NOT: -Drive a car -Advertising copywriter -Drink alcoholic beverages -Take any medication unless instructed by your physician -Make any legal decisions or sign important papers.  Meals: Start with liquid foods such as gelatin or soup. Progress to regular foods as tolerated. Avoid greasy, spicy, heavy foods. If nausea and/or vomiting occur, drink only clear liquids until the nausea and/or vomiting subsides. Call your physician if vomiting continues.  Special Instructions/Symptoms: Your throat may feel dry or sore from the anesthesia or the breathing tube placed in your throat during surgery. If this causes discomfort, gargle with warm salt water. The discomfort should disappear within 24 hours.  May take Tylenol beginning at 1 PM as needed for cramping/discomfort.

## 2021-03-27 NOTE — Op Note (Signed)
OPERATIVE NOTE  PATIENT:  Carrie Savage  50 y.o. female   PRE-OPERATIVE DIAGNOSIS:  menorrhagia, suspected endometrial polyp   POST-OPERATIVE DIAGNOSIS:  menorrhagia, endometrial polyp   PROCEDURE:  Procedure(s): DILATATION & CURETTAGE/HYSTEROSCOPY WITH MYOSURE POLYPECTOMY (N/A)   SURGEON:  Surgeon(s) and Role:    * Charlett Nose, MD - Primary     ANESTHESIA:   local and general   EBL:  5 mL    BLOOD ADMINISTERED:none   DRAINS: none    LOCAL MEDICATIONS USED:  LIDOCAINE  and Amount: 10 ml   SPECIMEN:  Source of Specimen:  endometrial polyp and endometrial curettings   DISPOSITION OF SPECIMEN:  PATHOLOGY   COUNTS:  YES   PLAN OF CARE: Discharge to home after PACU   PATIENT DISPOSITION:  PACU - hemodynamically stable.   FINDINGS: anteverted uterus sounded to 7cm. On hysterscopic view, bilateral ostia visualized. Large endometrial polyp noted with stalk at superior posterior uterine wall. Otherwise thin appearing endometrium. Total fluid deficit .  PROCEDURE IN DETAIL:  The patient voided immediately prior to transport to the operating room. The patient was appropriately consented and taken to the operating room where anesthesia was administered without difficulty. Thromboguards were placed and connected. She was placed in the dorsal lithotomy position in stirrups. She was examined under anesthesia and found to have an anteverted uterus. The patient was then prepped and draped in normal sterile fashion. A long speculum was inserted into the vagina. A single-tooth tenaculum was used to grasp the anterior lip of the cervix. A paracervical block was obtained by injecting a total of 74mL of 1% lidocaine at the 4 and 8 o'clock positions at the cervicovaginal junction. The uterus was sounded to 7cm. The cervical os was sequentially dilated using Pratt dilators to accommodate the hysteroscope. The hysteroscope was introduced under direct visualization and the uterus was  distended with normal saline. Bilateral ostia were visualized. Findings as noted above. The Myosure Reach device was used to resect the polyp and obtain global curettings. Specimen was collected for pathology. Hemostasis was noted in the uterine cavity. The hysteroscope was removed. A sharp curette was inserted and additional endometrial sampling obtained and sent for pathology. The tenaculum was removed from the cervix and good hemostasis was achieved at the puncture sites with silver nitrate. The patient tolerated the procedure well. The instrument and sponge counts were correct times two. The patient was awakened from anesthesia and taken to the recovery room in stable condition.  Derl Barrow, MD 03/27/21 9:21 AM

## 2021-03-27 NOTE — Anesthesia Postprocedure Evaluation (Signed)
Anesthesia Post Note  Patient: Carrie Savage  Procedure(s) Performed: DILATATION & CURETTAGE/HYSTEROSCOPY WITH MYOSURE POLYPECTOMY (Vagina )     Patient location during evaluation: PACU Anesthesia Type: General Level of consciousness: awake Pain management: pain level controlled Vital Signs Assessment: post-procedure vital signs reviewed and stable Respiratory status: spontaneous breathing Postop Assessment: no apparent nausea or vomiting Anesthetic complications: no   No notable events documented.  Last Vitals:  Vitals:   03/27/21 0910 03/27/21 0915  BP: 123/68 129/88  Pulse: 69 69  Resp: (!) 21 16  Temp: 36.5 C   SpO2: 98% 100%    Last Pain:  Vitals:   03/27/21 0700  TempSrc: Oral  PainSc: 0-No pain                 Natalee Tomkiewicz

## 2021-03-27 NOTE — Anesthesia Procedure Notes (Signed)
Procedure Name: LMA Insertion Date/Time: 03/27/2021 8:34 AM Performed by: Francie Massing, CRNA Pre-anesthesia Checklist: Patient identified, Emergency Drugs available, Suction available and Patient being monitored Patient Re-evaluated:Patient Re-evaluated prior to induction Oxygen Delivery Method: Circle system utilized Preoxygenation: Pre-oxygenation with 100% oxygen Induction Type: IV induction Ventilation: Mask ventilation without difficulty LMA: LMA inserted LMA Size: 4.0 Number of attempts: 1 Airway Equipment and Method: Bite block Placement Confirmation: positive ETCO2 Tube secured with: Tape Dental Injury: Teeth and Oropharynx as per pre-operative assessment

## 2021-03-27 NOTE — Brief Op Note (Signed)
03/27/2021  9:05 AM  PATIENT:  Carrie Savage  50 y.o. female  PRE-OPERATIVE DIAGNOSIS:  menorrhagia, suspected endometrial polyp  POST-OPERATIVE DIAGNOSIS:  menorrhagia, endometrial polyp  PROCEDURE:  Procedure(s): DILATATION & CURETTAGE/HYSTEROSCOPY WITH MYOSURE POLYPECTOMY (N/A)  SURGEON:  Surgeon(s) and Role:    * Shanetta Nicolls, Terrance Mass, MD - Primary   ANESTHESIA:   local and general  EBL:  5 mL   BLOOD ADMINISTERED:none  DRAINS: none   LOCAL MEDICATIONS USED:  LIDOCAINE  and Amount: 10 ml  SPECIMEN:  Source of Specimen:  endometrial polyp and endometrial curettings  DISPOSITION OF SPECIMEN:  PATHOLOGY  COUNTS:  YES  TOURNIQUET:  * No tourniquets in log *  DICTATION: .Note written in EPIC  PLAN OF CARE: Discharge to home after PACU  PATIENT DISPOSITION:  PACU - hemodynamically stable.   Delay start of Pharmacological VTE agent (>24hrs) due to surgical blood loss or risk of bleeding: not applicable

## 2021-03-27 NOTE — Anesthesia Preprocedure Evaluation (Addendum)
Anesthesia Evaluation  Patient identified by MRN, date of birth, ID band Patient awake    Reviewed: Allergy & Precautions, NPO status , Patient's Chart, lab work & pertinent test results  Airway Mallampati: II       Dental   Pulmonary sleep apnea ,    breath sounds clear to auscultation       Cardiovascular hypertension,  Rhythm:Regular Rate:Normal  Hx noted Dr. Chilton Si   Neuro/Psych PSYCHIATRIC DISORDERS    GI/Hepatic Neg liver ROS, GERD  ,Hx noted Dr.Luwana Butrick   Endo/Other    Renal/GU negative Renal ROS     Musculoskeletal   Abdominal   Peds  Hematology   Anesthesia Other Findings   Reproductive/Obstetrics                            Anesthesia Physical Anesthesia Plan  ASA: 3  Anesthesia Plan: General   Post-op Pain Management:    Induction:   PONV Risk Score and Plan: 3 and Ondansetron, Dexamethasone and Midazolam  Airway Management Planned: LMA  Additional Equipment:   Intra-op Plan:   Post-operative Plan: Extubation in OR  Informed Consent: I have reviewed the patients History and Physical, chart, labs and discussed the procedure including the risks, benefits and alternatives for the proposed anesthesia with the patient or authorized representative who has indicated his/her understanding and acceptance.     Dental advisory given  Plan Discussed with: CRNA and Anesthesiologist  Anesthesia Plan Comments:         Anesthesia Quick Evaluation

## 2021-03-27 NOTE — Transfer of Care (Signed)
Immediate Anesthesia Transfer of Care Note  Patient: Carrie Savage  Procedure(s) Performed: Procedure(s) (LRB): DILATATION & CURETTAGE/HYSTEROSCOPY WITH MYOSURE POLYPECTOMY (N/A)  Patient Location: PACU  Anesthesia Type: General  Level of Consciousness: awake, oriented, sedated and patient cooperative  Airway & Oxygen Therapy: Patient Spontanous Breathing and Patient connected to face mask oxygen  Post-op Assessment: Report given to PACU RN and Post -op Vital signs reviewed and stable  Post vital signs: Reviewed and stable  Complications: No apparent anesthesia complications Last Vitals:  Vitals Value Taken Time  BP    Temp    Pulse    Resp 21 03/27/21 0910  SpO2    Vitals shown include unvalidated device data.  Last Pain:  Vitals:   03/27/21 0700  TempSrc: Oral  PainSc: 0-No pain      Patients Stated Pain Goal: 5 (03/27/21 0700)  Complications: No notable events documented.

## 2021-03-29 ENCOUNTER — Encounter (HOSPITAL_BASED_OUTPATIENT_CLINIC_OR_DEPARTMENT_OTHER): Payer: Self-pay | Admitting: Obstetrics and Gynecology

## 2021-04-04 LAB — SURGICAL PATHOLOGY

## 2021-04-25 ENCOUNTER — Telehealth: Payer: Self-pay | Admitting: Internal Medicine

## 2021-04-25 NOTE — Telephone Encounter (Signed)
Spoke with the pt and notified of [facility]'s phone number. Nothing further needed.

## 2021-05-12 DIAGNOSIS — G4733 Obstructive sleep apnea (adult) (pediatric): Secondary | ICD-10-CM | POA: Insufficient documentation

## 2021-05-12 NOTE — Assessment & Plan Note (Signed)
Emphasized that weight loss would significantly help sleep apnea and her general health is well.

## 2021-05-12 NOTE — Assessment & Plan Note (Signed)
We are helping her look into both CPAP, recognizing current supply chain delay, and also into fitted oral appliance with referral.  She will decide. Plan-referral for CPAP 5-15.  Referral to consider oral appliance.

## 2021-05-31 ENCOUNTER — Ambulatory Visit: Payer: BC Managed Care – PPO | Admitting: Internal Medicine

## 2021-06-12 ENCOUNTER — Encounter: Payer: Self-pay | Admitting: Internal Medicine

## 2021-06-14 ENCOUNTER — Telehealth: Payer: Self-pay | Admitting: Internal Medicine

## 2021-06-14 NOTE — Progress Notes (Signed)
ddHPI ?F never smoker followed for OSA, complicated by PSVT, HTN, Ulcerative Colitis, GERD, Obesity, Hyperlipidemia, Anxiety ?HST 11/05/20- AHI 24.2/ hr, desaturation to 80%, body weight 244 lbs ? ?======================================================= ? ? ?01/28/21- 50 yoF never smoker followed for OSA, complicated by PSVT, HTN, Ulcerative Colitis, GERD, Obesity, Hyperlipidemia, Anxiety ?HST 11/05/20- AHI 24.2/ hr, desaturation to 80%, body weight 244 lbs ?Needs PAP referral ?Body weight today-250 lbs ?Covid vax- 3 Phizer ?Flu vax- today ?-----Follow up on at home sleep study. ?We reviewed her sleep study results, symptoms, and treatment options.  She did not make a firm choice at this visit so we are going to help her look deeper into both CPAP and a fitted oral appliance.  She will decide which way she wants to go. ? ?06/17/21-  50 yoF never smoker followed for OSA, complicated by PSVT, HTN, Ulcerative Colitis, GERD, Obesity, Hyperlipidemia, Anxiety ?HST 11/05/20- AHI 24.2/ hr, desaturation to 80%, body weight 244 lbs ?We referred in Oct to Dr Myrtis Ser to consider OAP and to [facility] consider CPAP 5-15 ?Download compliance 77%, AHI 0.9/ hr ?Body weight today-251 lbs ?Covid vax- 3 Phizer ?Flu vax- today ?-----F/U on OSA. States she has doing well with cpap machine. Uses [facility] as her DME.  ?She chose to go with CPAP.  Adjusted mask for comfort and able to use more consistently now.  She feels she is sleeping better.  We discussed compliance goals and reviewed her download. ? ?ROS-see HPI   + = positive ?Constitutional:    weight loss, night sweats, fevers, chills, fatigue, lassitude. ?HEENT:    headaches, difficulty swallowing, tooth/dental problems, sore throat,  ?     sneezing, itching, ear ache, nasal congestion, post nasal drip, snoring ?CV:    chest pain, orthopnea, PND, swelling in lower extremities, anasarca,                                  ? dizziness, palpitations ?Resp:   shortness of breath with exertion or  at rest.   ?             productive cough,   non-productive cough, coughing up of blood.   ?           change in color of mucus.  wheezing.   ?Skin:    rash or lesions. ?GI:  No-   heartburn, indigestion, abdominal pain, nausea, vomiting, diarrhea,  ?               change in bowel habits, loss of appetite ?GU: dysuria, change in color of urine, no urgency or frequency.   flank pain. ?MS:   joint pain, stiffness, decreased range of motion, back pain. ?Neuro-     nothing unusual ?Psych:  change in mood or affect.  depression or anxiety.   memory loss. ? ?OBJ- Physical Exam ?General- Alert, Oriented, Affect-appropriate, Distress- none acute, + obese ?Skin- rash-none, lesions- none, excoriation- none ?Lymphadenopathy- none ?Head- atraumatic ?           Eyes- Gross vision intact, PERRLA, conjunctivae and secretions clear ?           Ears- Hearing, canals-normal ?           Nose- Clear, no-Septal dev, mucus, polyps, erosion, perforation  ?           Throat- Mallampati III , mucosa clear , drainage- none, tonsils- atrophic, + teeth ?Neck- flexible , trachea midline,  no stridor , thyroid nl, carotid no bruit ?Chest - symmetrical excursion , unlabored ?          Heart/CV- RRR , no murmur , no gallop  , no rub, nl s1 s2 ?                          - JVD- none , edema- none, stasis changes- none, varices- none ?          Lung- +clear, wheeze- none, cough- none , dullness-none, rub- none ?          Chest wall-  ?Abd-  ?Br/ Gen/ Rectal- Not done, not indicated ?Extrem- cyanosis- none, clubbing, none, atrophy- none, strength- nl ?Neuro- grossly intact to observation ? ?

## 2021-06-17 ENCOUNTER — Ambulatory Visit: Payer: BC Managed Care – PPO | Admitting: Internal Medicine

## 2021-06-17 ENCOUNTER — Other Ambulatory Visit: Payer: Self-pay

## 2021-06-17 ENCOUNTER — Encounter: Payer: Self-pay | Admitting: Internal Medicine

## 2021-06-17 DIAGNOSIS — E669 Obesity, unspecified: Secondary | ICD-10-CM | POA: Diagnosis not present

## 2021-06-17 DIAGNOSIS — G4733 Obstructive sleep apnea (adult) (pediatric): Secondary | ICD-10-CM

## 2021-06-17 NOTE — Patient Instructions (Signed)
We can continue CPAP auto 5-15, mask of choice, humidifier, supplies, AirView/ card  Please call if we can help 

## 2021-06-17 NOTE — Assessment & Plan Note (Signed)
She may need external support to achieve lifestyle change for meaningful weight loss. ?

## 2021-06-17 NOTE — Telephone Encounter (Signed)
Called and spoke with patient about SD card. Informed patient to keep new SD card in her CPAP machine and bring in that card to her visit. Nothing further  ?

## 2021-06-17 NOTE — Assessment & Plan Note (Signed)
I think she will be able to make this work for her.  She benefits from CPAP and has gone to her DME company to achieve comfortable mask fit. ?Plan-continue auto 5-15 ?

## 2021-07-15 NOTE — Progress Notes (Signed)
? ? ? ? ?ASN:[id number] supraventricular tachycardia.  Patient seen in May 2019 with palpitations.  Echocardiogram June 2019 showed normal LV function, mild left ventricular hypertrophy, grade 1 diastolic dysfunction, mild left atrial enlargement.  Patient had an episode of documented supraventricular tachycardia June 2019.  She was referred to Dr. Ladona Ridgel for consideration of ablation but she preferred conservative measures at that time.  Since last seen patient states that over the past year she has had occasional chest pain.  It is substernal and described as a pressure with some pain in her left upper extremity.  No associated nausea, dyspnea or diaphoresis.  She attributes the pain to stress.  She does not have exertional chest pain.  Typically lasts 15 minutes and resolve spontaneously. ? ?Current Outpatient Medications  ?Medication Sig Dispense Refill  ? ALPRAZolam (XANAX) 0.5 MG tablet Take 0.5 mg by mouth 3 (three) times daily as needed for anxiety.    ? amLODipine (NORVASC) 5 MG tablet Take 5 mg by mouth daily.    ? Calcium Carbonate-Vitamin D 500-125 MG-UNIT TABS Take 1 tablet by mouth daily.    ? dextromethorphan-guaiFENesin (MUCINEX DM) 30-600 MG 12hr tablet Take 1 tablet by mouth 2 (two) times daily as needed for cough.    ? ezetimibe (ZETIA) 10 MG tablet Take 10 mg by mouth at bedtime.    ? loratadine (CLARITIN) 10 MG tablet Take 10 mg by mouth daily.    ? metoprolol succinate (TOPROL-XL) 100 MG 24 hr tablet Take 1 tablet (100 mg total) by mouth 2 (two) times daily. Take with or immediately following a meal. (Patient taking differently: Take 100 mg by mouth 2 (two) times daily. Take with or immediately following a meal.) 180 tablet 3  ? Multiple Vitamins-Minerals (MULTIVITAMIN WITH MINERALS) tablet Take 1 tablet by mouth daily.    ? omeprazole (PRILOSEC) 40 MG capsule Take 40 mg by mouth 2 (two) times daily.    ? sertraline (ZOLOFT) 100 MG tablet Take 100 mg by mouth at bedtime.    ? simvastatin  (ZOCOR) 80 MG tablet Take 80 mg by mouth at bedtime.    ? telmisartan (MICARDIS) 80 MG tablet Take 1 tablet (80 mg total) by mouth daily. (Patient taking differently: Take 80 mg by mouth daily.) 90 tablet 3  ? ?No current facility-administered medications for this visit.  ? ? ? ?Past Medical History:  ?Diagnosis Date  ? Allergic rhinitis   ? First degree heart block   ? GAD (generalized anxiety disorder)   ? GERD (gastroesophageal reflux disease)   ? History of abnormal cervical Pap smear 10/2016  ? s/p  LEEP in gyn office  ? History of COVID-19 12/2019  ? per pt mild symptoms that resolved  ? History of ulcerative colitis   ? per pt dx age 27, was on medication until early age 15s remission since;  followed by dr Laural Benes (GI)  ? Hyperlipidemia   ? Hypertension   ? Menorrhagia   ? OSA (obstructive sleep apnea)   ? followed by dr c. young---  sleep study in epic 11-05-2020, moderate OSA per pt  cpap on back order  ? Paroxysmal SVT (supraventricular tachycardia) (HCC) 09/2017  ? cardiologist-- dr Jens Som,  takes toprol  ? Wears contact lenses   ? ? ?Past Surgical History:  ?Procedure Laterality Date  ? CESAREAN SECTION  10/02/2005  ? @[facility]   ? CHOLECYSTECTOMY, LAPAROSCOPIC  03/29/2009  ? @[facility]   ? COLONOSCOPY WITH PROPOFOL N/A 06/10/2016  ? Procedure: COLONOSCOPY WITH  PROPOFOL;  Surgeon: Charolett Bumpers, MD;  Location: [facility];  Service: Endoscopy;  Laterality: N/A;  ? DILATATION & CURETTAGE/HYSTEROSCOPY WITH MYOSURE N/A 03/27/2021  ? Procedure: DILATATION & CURETTAGE/HYSTEROSCOPY WITH MYOSURE POLYPECTOMY;  Surgeon: Charlett Nose, MD;  Location: [facility];  Service: Gynecology;  Laterality: N/A;  ? ? ?Social History  ? ?Socioeconomic History  ? Marital status: Married  ?  Spouse name: Not on file  ? Number of children: 1  ? Years of education: Not on file  ? Highest education level: Not on file  ?Occupational History  ? Occupation: SEDENTARY  ?Tobacco Use  ? Smoking status: Never  ? Smokeless  tobacco: Never  ?Vaping Use  ? Vaping Use: Never used  ?Substance and Sexual Activity  ? Alcohol use: Yes  ?  Comment: Occasional  ? Drug use: Never  ? Sexual activity: Not on file  ?Other Topics Concern  ? Not on file  ?Social History Narrative  ? Lives with husband and son.   ? ?Social Determinants of Health  ? ?Financial Resource Strain: Not on file  ?Food Insecurity: Not on file  ?Transportation Needs: Not on file  ?Physical Activity: Not on file  ?Stress: Not on file  ?Social Connections: Not on file  ?Intimate Partner Violence: Not on file  ? ? ?Family History  ?Problem Relation Age of Onset  ? Hyperlipidemia Mother   ? Hypertension Father   ? Atrial fibrillation Father   ? Heart attack Maternal Grandmother   ? Diabetes Mellitus I Maternal Grandfather   ? CAD Paternal Grandfather   ? Obesity Paternal Grandfather   ? ? ?ROS: no fevers or chills, productive cough, hemoptysis, dysphasia, odynophagia, melena, hematochezia, dysuria, hematuria, rash, seizure activity, orthopnea, PND, pedal edema, claudication. Remaining systems are negative. ? ?Physical Exam: ?Well-developed well-nourished in no acute distress.  ?Skin is warm and dry.  ?HEENT is normal.  ?Neck is supple.  ?Chest is clear to auscultation with normal expansion.  ?Cardiovascular exam is regular rate and rhythm.  ?Abdominal exam nontender or distended. No masses palpated. ?Extremities show no edema. ?neuro grossly intact ? ?ECG-normal sinus rhythm at a rate of 75, nonspecific ST changes.  Personally reviewed ? ?A/P ? ?1 SVT-patient continues to prefer conservative management.  Continue beta-blocker.  Will consider referral for ablation in the future if she has more frequent episodes. ?  ?2 hypertension-blood pressure controlled.  Continue present medical regimen. ?  ?3 hyperlipidemia-continue present medications.   ?  ?4 OSA-followed by pulmonary. ? ?5 chest pain-both typical and atypical features.  This has been present intermittently for 1 year.  I  will arrange a cardiac CTA to rule out obstructive coronary disease. ? ?Olga Millers, MD ? ? ? ?

## 2021-07-17 ENCOUNTER — Ambulatory Visit: Payer: BC Managed Care – PPO | Admitting: Cardiology

## 2021-07-17 ENCOUNTER — Other Ambulatory Visit: Payer: Self-pay | Admitting: *Deleted

## 2021-07-17 ENCOUNTER — Encounter: Payer: Self-pay | Admitting: Cardiology

## 2021-07-17 VITALS — BP 111/66 | HR 75 | Ht 66.0 in | Wt 252.0 lb

## 2021-07-17 DIAGNOSIS — R072 Precordial pain: Secondary | ICD-10-CM

## 2021-07-17 DIAGNOSIS — I10 Essential (primary) hypertension: Secondary | ICD-10-CM | POA: Diagnosis not present

## 2021-07-17 DIAGNOSIS — E7800 Pure hypercholesterolemia, unspecified: Secondary | ICD-10-CM

## 2021-07-17 DIAGNOSIS — I471 Supraventricular tachycardia: Secondary | ICD-10-CM

## 2021-07-17 MED ORDER — METOPROLOL TARTRATE 100 MG PO TABS: ORAL_TABLET | ORAL | 0 refills | Status: DC

## 2021-07-17 NOTE — Patient Instructions (Signed)
?Testing/Procedures: ? ? ?Your cardiac CT will be scheduled at  ? ?[facility] ?[address] ?[place], [place] [address] ?[phone] ? ?If scheduled at [facility], please arrive at the [facility] and Children's Entrance (Entrance C2) of [facility] 30 minutes prior to test start time. ?You can use the FREE valet parking offered at entrance C (encouraged to control the heart rate for the test)  ?Proceed to the [facility] Radiology Department (first floor) to check-in and test prep. ? ?All radiology patients and guests should use entrance C2 at [address] [facility], accessed from [facility], even though the hospital's physical address listed is [address] ? ? ? ? ?Please follow these instructions carefully (unless otherwise directed): ? ? ? ?On the Night Before the Test: ?Be sure to Drink plenty of water. ?Do not consume any caffeinated/decaffeinated beverages or chocolate 12 hours prior to your test. ?Do not take any antihistamines 12 hours prior to your test. ? ?On the Day of the Test: ?Drink plenty of water until 1 hour prior to the test. ?Do not eat any food 4 hours prior to the test. ?You may take your regular medications prior to the test.  ?Take metoprolol (Lopressor) 100 mg two hours prior to test. ?HOLD Furosemide/Hydrochlorothiazide morning of the test. ?FEMALES- please wear underwire-free bra if available, avoid dresses & tight clothing ? ?     ?After the Test: ?Drink plenty of water. ?After receiving IV contrast, you may experience a mild flushed feeling. This is normal. ?On occasion, you may experience a mild rash up to 24 hours after the test. This is not dangerous. If this occurs, you can take Benadryl 25 mg and increase your fluid intake. ?If you experience trouble breathing, this can be serious. If it is severe call 911 IMMEDIATELY. If it is mild, please call our office. ?If you take any of these medications: Glipizide/Metformin, Avandament,  Glucavance, please do not take 48 hours after completing test unless otherwise instructed. ? ?We will call to schedule your test 2-4 weeks out understanding that some insurance companies will need an authorization prior to the service being performed.  ? ?For non-scheduling related questions, please contact the cardiac imaging nurse navigator should you have any questions/concerns: ?Rockwell Alexandria, Cardiac Imaging Nurse Navigator ?Larey Brick, Cardiac Imaging Nurse Navigator ?[facility] ?Direct Office Dial: [phone]  ? ?For scheduling needs, including cancellations and rescheduling, please call [place], [phone].  ? ? ?Follow-Up: ?At [facility], you and your health needs are our priority.  As part of our continuing mission to provide you with exceptional heart care, we have created designated Provider Care Teams.  These Care Teams include your primary Cardiologist (physician) and Advanced Practice Providers (APPs -  Physician Assistants and Nurse Practitioners) who all work together to provide you with the care you need, when you need it. ? ?We recommend signing up for the patient portal called "MyChart".  Sign up information is provided on this After Visit Summary.  MyChart is used to connect with patients for Virtual Visits (Telemedicine).  Patients are able to view lab/test results, encounter notes, upcoming appointments, etc.  Non-urgent messages can be sent to your provider as well.   ?To learn more about what you can do with MyChart, go to [email or web address].   ? ?Your next appointment:   ?6 month(s) ? ?The format for your next appointment:   ?In Person ? ?Provider:   ?Olga Millers, MD  ? ? ?

## 2021-07-17 NOTE — Progress Notes (Signed)
bmp 

## 2021-08-21 ENCOUNTER — Ambulatory Visit
Admission: RE | Admit: 2021-08-21 | Discharge: 2021-08-21 | Disposition: A | Discharge status: Home / Self Care | Payer: BC Managed Care – PPO | Source: Ambulatory Visit | Attending: Family Medicine | Admitting: Family Medicine

## 2021-08-21 ENCOUNTER — Other Ambulatory Visit: Payer: Self-pay | Admitting: Family Medicine

## 2021-08-21 DIAGNOSIS — N6310 Unspecified lump in the right breast, unspecified quadrant: Secondary | ICD-10-CM

## 2021-08-21 DIAGNOSIS — R928 Other abnormal and inconclusive findings on diagnostic imaging of breast: Secondary | ICD-10-CM

## 2021-08-21 DIAGNOSIS — N6320 Unspecified lump in the left breast, unspecified quadrant: Secondary | ICD-10-CM

## 2021-08-26 ENCOUNTER — Telehealth (HOSPITAL_COMMUNITY): Payer: Self-pay | Admitting: *Deleted

## 2021-08-26 ENCOUNTER — Other Ambulatory Visit (HOSPITAL_COMMUNITY): Payer: Self-pay | Admitting: *Deleted

## 2021-08-26 DIAGNOSIS — R079 Chest pain, unspecified: Secondary | ICD-10-CM

## 2021-08-26 NOTE — Telephone Encounter (Signed)
Reaching out to patient to offer assistance regarding upcoming cardiac imaging study; pt verbalizes understanding of appt date/time, parking situation and where to check in, pre-test NPO status and medications ordered, and verified current allergies; name and call back number provided for further questions should they arise  Leeah Politano RN Navigator Cardiac Imaging [facility] [phone] office [phone] cell  Patient to take 100mg metoprolol tartrate two hours prior to her cardiac CT scan.  She is aware to arrive at 8am. 

## 2021-08-27 ENCOUNTER — Ambulatory Visit (HOSPITAL_COMMUNITY)
Admission: RE | Admit: 2021-08-27 | Discharge: 2021-08-27 | Disposition: A | Discharge status: Home / Self Care | Payer: BC Managed Care – PPO | Source: Ambulatory Visit | Attending: Cardiology | Admitting: Cardiology

## 2021-08-27 ENCOUNTER — Encounter (HOSPITAL_COMMUNITY): Payer: Self-pay

## 2021-08-27 DIAGNOSIS — R079 Chest pain, unspecified: Secondary | ICD-10-CM | POA: Insufficient documentation

## 2021-08-27 IMAGING — CT CT HEART MORP W/ CTA COR W/ SCORE W/ CA W/CM &/OR W/O CM
4 of 7 series · 8 of 20 positions shown, 9 images · IV contrast (APPLIED)
Comparison: None Available.
COMPARISON: None Available.

Addendum:
EXAM:
OVER-READ INTERPRETATION  CT CHEST

The following report is a limited chest CT over-read performed by
08/27/2021. This over-read does not include interpretation of cardiac
or coronary anatomy or pathology. The coronary calcium score and
cardiac CTA interpretation by the cardiologist is attached.
CLINICAL DATA: 50 yo female with chest pain
Cardiac/Coronary CTA
TECHNIQUE: A non-contrast, gated CT scan was obtained with axial slices of 3 mm
through the heart for calcium scoring. Calcium scoring was performed
using the Agatston method. A 120 kV prospective, gated, contrast
cardiac scan was obtained. Gantry rotation speed was 250 msecs and
collimation was 0.6 mm. Two sublingual nitroglycerin tablets (0.8
mg) were given. The 3D data set was reconstructed in 5% intervals of
the 35-75% of the R-R cycle. Diastolic phases were analyzed on a
dedicated workstation using MPR, MIP, and VRT modes. The patient
received 95 cc of contrast.

[Series 6: best diast · axial · 0.39mm/px · z∈[+1262,+1299]mm · 2 of 278 slices shown, 3 images]
[im 93/278  vessel]
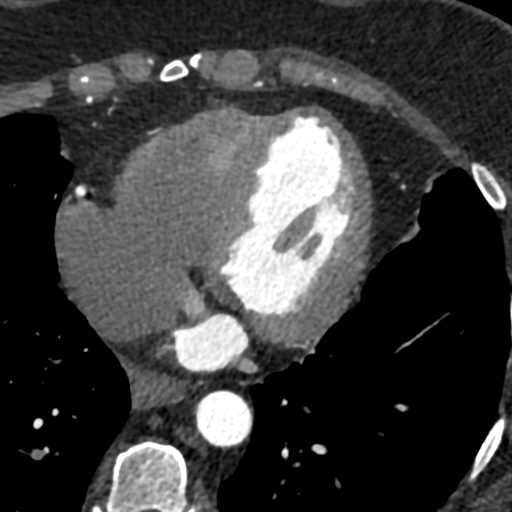
[im 93/278  lung]
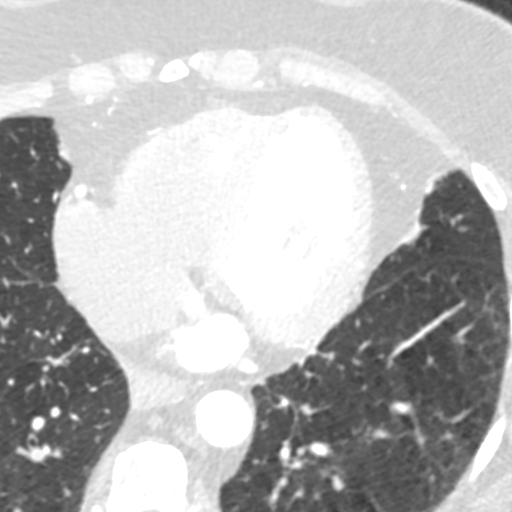
[im 185/278  vessel]
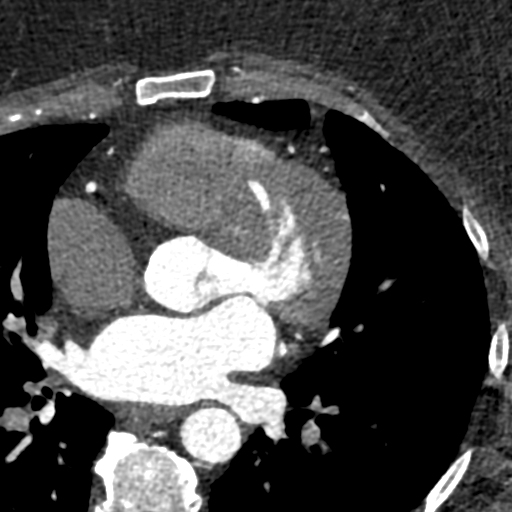

[Series 7: best syst · axial · 0.39mm/px · z∈[+1262,+1299]mm · 2 of 278 slices shown]
[im 93/278  vessel]
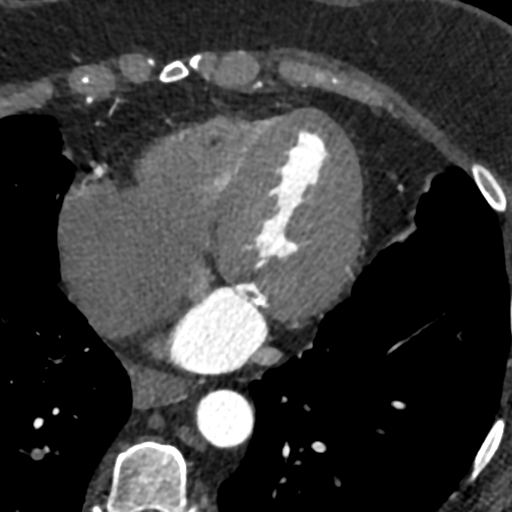
[im 185/278  vessel]
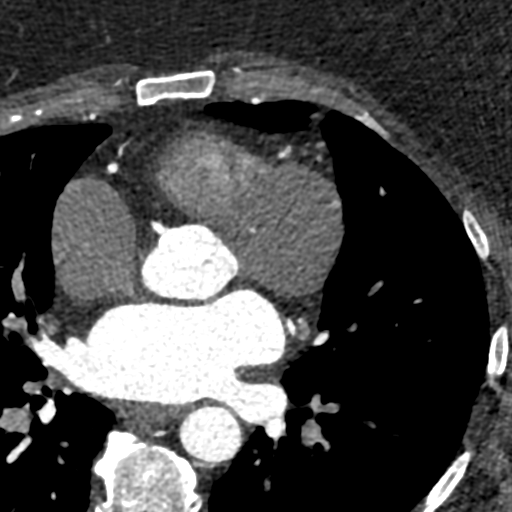

[Series 8: ts diast sharp · axial · 0.39mm/px · z∈[+1262,+1299]mm · 2 of 278 slices shown]
[im 93/278  lung]
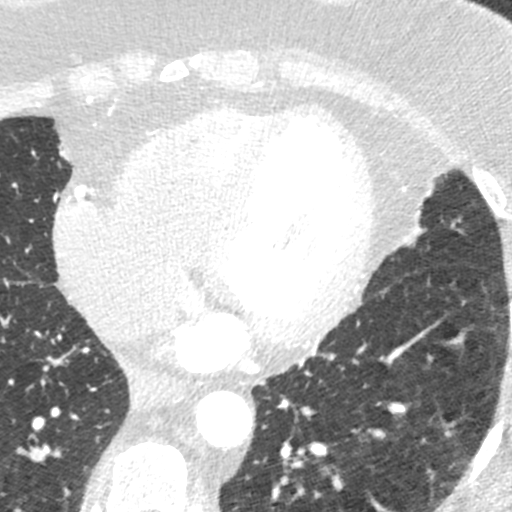
[im 185/278  lung]
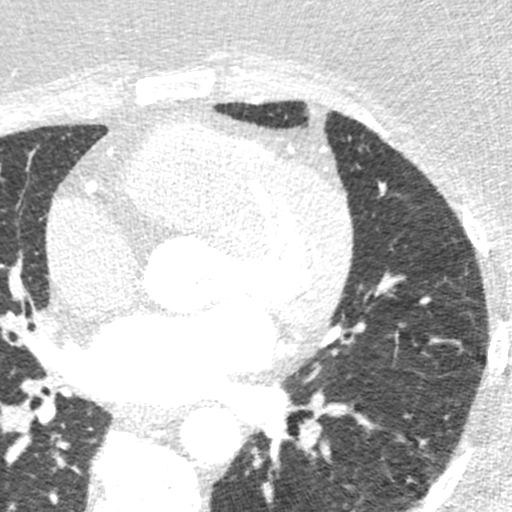

[Series 9: ts syst sharp · axial · 0.39mm/px · z∈[+1262,+1299]mm · 2 of 278 slices shown]
[im 93/278  lung]
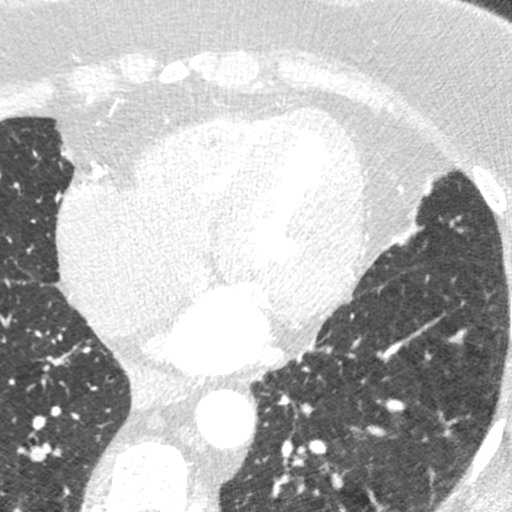
[im 185/278  lung]
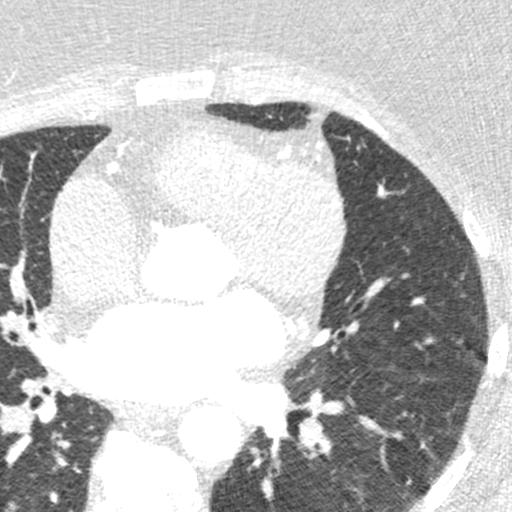

[8 of 20 positions shown; findings below may reference images not displayed]

FINDINGS: Atherosclerotic calcifications in the thoracic aorta. Within the
visualized portions of the thorax there are no suspicious appearing
pulmonary nodules or masses, there is no acute consolidative
airspace disease, no pleural effusions, no pneumothorax and no
lymphadenopathy. Visualized portions of the upper abdomen
demonstrate diffuse low attenuation throughout the visualized
hepatic parenchyma, indicative of a background of hepatic steatosis.
There are no aggressive appearing lytic or blastic lesions noted in
the visualized portions of the skeleton.
IMPRESSION: 1.  Aortic Atherosclerosis (BHE74-X8G.G).
2. Hepatic steatosis.
FINDINGS: Image quality: Average.

Noise artifact is: Moderater. (signal-to-noise).

Coronary Arteries:  Normal coronary origin.  Right dominance.

Left main: The left main is a large caliber vessel with a normal
take off from the left coronary cusp that bifurcates to form a left
anterior descending artery and a left circumflex artery. There is no
plaque or stenosis.

Left anterior descending artery: The LAD is patent without evidence
of plaque or stenosis. The LAD gives off 3 patent diagonal branches.

Left circumflex artery: The LCX is non-dominant and patent with no
evidence of plaque or stenosis. The LCX gives off 3 patent obtuse
marginal branches (OM3 small and not well visualized).

Right coronary artery: The RCA is dominant with normal take off from
the right coronary cusp. There is no evidence of plaque or stenosis.
Note there is aortic atherosclerosis in the aorta at takeoff of RCA
but does not appear to obstruct the vessel. The RCA terminates as a
PDA and right posterolateral branch without evidence of plaque or
stenosis.

Right Atrium: Right atrial size is within normal limits.

Right Ventricle: The right ventricular cavity is within normal
limits.

Left Atrium: Left atrial size is normal in size with no left atrial
appendage filling defect.

Left Ventricle: The ventricular cavity size is within normal limits.
There are no stigmata of prior infarction. There is no abnormal
filling defect.

Pulmonary arteries: Normal in size without proximal filling defect.

Pulmonary veins: Normal pulmonary venous drainage.

Pericardium: Normal thickness with no significant effusion or
calcium present.

Cardiac valves: The aortic valve is trileaflet without significant
calcification. The mitral valve is normal structure without
significant calcification.

Aorta: Normal caliber with aortic atherosclerosis.

Extra-cardiac findings: See attached radiology report for
non-cardiac structures.
IMPRESSION: 1. Coronary calcium score of 0.839. This was 80 percentile for age-,
sex, and race-matched controls.

2. Normal coronary origin with right dominance.

3. No evidence of CAD.

4. Aortic atherosclerosis.

RECOMMENDATIONS:
CAD-RADS 0: No evidence of CAD (0%). Consider non-atherosclerotic
causes of chest pain.

*** End of Addendum ***
EXAM:
OVER-READ INTERPRETATION  CT CHEST

The following report is a limited chest CT over-read performed by
08/27/2021. This over-read does not include interpretation of cardiac
or coronary anatomy or pathology. The coronary calcium score and
cardiac CTA interpretation by the cardiologist is attached.
FINDINGS: Atherosclerotic calcifications in the thoracic aorta. Within the
visualized portions of the thorax there are no suspicious appearing
pulmonary nodules or masses, there is no acute consolidative
airspace disease, no pleural effusions, no pneumothorax and no
lymphadenopathy. Visualized portions of the upper abdomen
demonstrate diffuse low attenuation throughout the visualized
hepatic parenchyma, indicative of a background of hepatic steatosis.
There are no aggressive appearing lytic or blastic lesions noted in
the visualized portions of the skeleton.
IMPRESSION: 1.  Aortic Atherosclerosis (BHE74-X8G.G).
2. Hepatic steatosis.

## 2021-08-27 MED ORDER — NITROGLYCERIN 0.4 MG SL SUBL
SUBLINGUAL_TABLET | SUBLINGUAL | Status: AC
  Filled 2021-08-27: qty 2

## 2021-08-27 MED ORDER — NITROGLYCERIN 0.4 MG SL SUBL
0.8000 mg | SUBLINGUAL_TABLET | Freq: Once | SUBLINGUAL | Status: AC
Start: 2021-08-27 — End: 2021-08-27
  Administered 2021-08-27: 0.8 mg via SUBLINGUAL

## 2021-08-27 MED ORDER — IOHEXOL 350 MG/ML SOLN
100.0000 mL | Freq: Once | INTRAVENOUS | Status: AC | PRN
  Administered 2021-08-27: 100 mL via INTRAVENOUS

## 2021-08-30 ENCOUNTER — Encounter: Payer: Self-pay | Admitting: *Deleted

## 2021-09-11 ENCOUNTER — Ambulatory Visit: Payer: BC Managed Care – PPO | Admitting: Cardiology

## 2022-01-31 ENCOUNTER — Ambulatory Visit
Admission: RE | Admit: 2022-01-31 | Discharge: 2022-01-31 | Disposition: A | Discharge status: Home / Self Care | Payer: BC Managed Care – PPO | Source: Ambulatory Visit | Attending: Family Medicine | Admitting: Family Medicine

## 2022-01-31 DIAGNOSIS — N6320 Unspecified lump in the left breast, unspecified quadrant: Secondary | ICD-10-CM

## 2022-01-31 DIAGNOSIS — N6310 Unspecified lump in the right breast, unspecified quadrant: Secondary | ICD-10-CM

## 2022-02-20 ENCOUNTER — Encounter: Payer: Self-pay | Admitting: Internal Medicine

## 2022-02-20 ENCOUNTER — Ambulatory Visit (INDEPENDENT_AMBULATORY_CARE_PROVIDER_SITE_OTHER): Payer: BC Managed Care – PPO | Admitting: Internal Medicine

## 2022-02-20 VITALS — BP 128/76 | HR 75 | Ht 66.0 in | Wt 253.0 lb

## 2022-02-20 DIAGNOSIS — G4733 Obstructive sleep apnea (adult) (pediatric): Secondary | ICD-10-CM | POA: Diagnosis not present

## 2022-02-20 DIAGNOSIS — E669 Obesity, unspecified: Secondary | ICD-10-CM | POA: Diagnosis not present

## 2022-02-20 DIAGNOSIS — Z23 Encounter for immunization: Secondary | ICD-10-CM

## 2022-02-20 NOTE — Assessment & Plan Note (Signed)
Encourage diet/ exercise

## 2022-02-20 NOTE — Progress Notes (Signed)
ddHPI F never smoker followed for OSA, complicated by PSVT, HTN, Ulcerative Colitis, GERD, Obesity, Hyperlipidemia, Anxiety HST 11/05/20- AHI 24.2/ hr, desaturation to 80%, body weight 244 lbs  =======================================================   06/17/21-  50 yoF never smoker followed for OSA, complicated by PSVT, HTN, Ulcerative Colitis, GERD, Obesity, Hyperlipidemia, Anxiety HST 11/05/20- AHI 24.2/ hr, desaturation to 80%, body weight 244 lbs We referred in Oct to Dr Myrtis Ser to consider OAP and to [facility] consider CPAP 5-15 Download compliance 77%, AHI 0.9/ hr Body weight today-251 lbs Covid vax- 3 Phizer Flu vax- today -----F/U on OSA. States she has doing well with cpap machine. Uses [facility] as her DME.  She chose to go with CPAP.  Adjusted mask for comfort and able to use more consistently now.  She feels she is sleeping better.  We discussed compliance goals and reviewed her download.  02/20/22-  59 yoF never smoker followed for OSA, complicated by PSVT, HTN, Ulcerative Colitis, GERD, Obesity, Hyperlipidemia, Anxiety CPAP auto 5-15/ [facility] Download compliance- not available Body weight today- Covid vax- 3 Phizer Flu vax-today standard -----Pt would like a new mask for CPAP Camping without CPAP> husband tells her she snores loudly, so she is satisfied CPAP continues to help. Cardiology hass assessed for tachypalpitation.  ROS-see HPI   + = positive Constitutional:    weight loss, night sweats, fevers, chills, fatigue, lassitude. HEENT:    headaches, difficulty swallowing, tooth/dental problems, sore throat,       sneezing, itching, ear ache, nasal congestion, post nasal drip, snoring CV:    chest pain, orthopnea, PND, swelling in lower extremities, anasarca,                                   dizziness, palpitations Resp:   shortness of breath with exertion or at rest.                productive cough,   non-productive cough, coughing up of blood.              change in color  of mucus.  wheezing.   Skin:    rash or lesions. GI:  No-   heartburn, indigestion, abdominal pain, nausea, vomiting, diarrhea,                 change in bowel habits, loss of appetite GU: dysuria, change in color of urine, no urgency or frequency.   flank pain. MS:   joint pain, stiffness, decreased range of motion, back pain. Neuro-     nothing unusual Psych:  change in mood or affect.  depression or anxiety.   memory loss.  OBJ- Physical Exam General- Alert, Oriented, Affect-appropriate, Distress- none acute, + obese Skin- rash-none, lesions- none, excoriation- none Lymphadenopathy- none Head- atraumatic            Eyes- Gross vision intact, PERRLA, conjunctivae and secretions clear            Ears- Hearing, canals-normal            Nose- Clear, no-Septal dev, mucus, polyps, erosion, perforation             Throat- Mallampati III , mucosa clear , drainage- none, tonsils- atrophic, + teeth Neck- flexible , trachea midline, no stridor , thyroid nl, carotid no bruit Chest - symmetrical excursion , unlabored           Heart/CV- RRR , no murmur , no gallop  ,  no rub, nl s1 s2                           - JVD- none , edema- none, stasis changes- none, varices- none           Lung- +clear, wheeze- none, cough- none , dullness-none, rub- none           Chest wall-  Abd-  Br/ Gen/ Rectal- Not done, not indicated Extrem- cyanosis- none, clubbing, none, atrophy- none, strength- nl Neuro- grossly intact to observation

## 2022-02-20 NOTE — Patient Instructions (Signed)
Order- DME [facility]- please replace CPAP mask of choice, continue auto 5-15, humidifier, supplies, AirView/ card  [facility]- flu vax standard

## 2022-02-20 NOTE — Assessment & Plan Note (Signed)
Benefits from CPAP, describing good compliance and control Plan- continue auto 5-15. Replace mask

## 2022-04-28 NOTE — Progress Notes (Signed)
HPI: Follow-up supraventricular tachycardia.  Patient seen in May 2019 with palpitations.  Echocardiogram June 2019 showed normal LV function, mild left ventricular hypertrophy, grade 1 diastolic dysfunction, mild left atrial enlargement.  Patient had an episode of documented supraventricular tachycardia June 2019.  She was referred to Dr. Lovena Le for consideration of ablation but she preferred conservative measures at that time.  Cardiac CTA May 2023 showed calcium score 0.839 which was 80th percentile but no significant coronary disease noted.  There was note of aortic atherosclerosis.  Since last seen she denies increased dyspnea on exertion, orthopnea, PND, pedal edema or exertional chest pain.  She has had occasional palpitations/SVT that she terminates with Valsalva easily.  Current Outpatient Medications  Medication Sig Dispense Refill   ALPRAZolam (XANAX) 0.5 MG tablet Take 0.5 mg by mouth 3 (three) times daily as needed for anxiety.     amLODipine (NORVASC) 5 MG tablet Take 5 mg by mouth daily.     Calcium Carbonate-Vitamin D 500-125 MG-UNIT TABS Take 1 tablet by mouth daily.     dextromethorphan-guaiFENesin (MUCINEX DM) 30-600 MG 12hr tablet Take 1 tablet by mouth 2 (two) times daily as needed for cough.     ezetimibe (ZETIA) 10 MG tablet Take 10 mg by mouth at bedtime.     loratadine (CLARITIN) 10 MG tablet Take 10 mg by mouth daily.     metoprolol succinate (TOPROL-XL) 100 MG 24 hr tablet Take 100 mg by mouth daily. Take with or immediately following a meal.     Multiple Vitamins-Minerals (MULTIVITAMIN WITH MINERALS) tablet Take 1 tablet by mouth daily.     omeprazole (PRILOSEC) 40 MG capsule Take 40 mg by mouth daily.     simvastatin (ZOCOR) 80 MG tablet Take 80 mg by mouth at bedtime.     telmisartan (MICARDIS) 80 MG tablet Take 1 tablet (80 mg total) by mouth daily. 90 tablet 3   No current facility-administered medications for this visit.     Past Medical History:   Diagnosis Date   Allergic rhinitis    First degree heart block    GAD (generalized anxiety disorder)    GERD (gastroesophageal reflux disease)    History of abnormal cervical Pap smear 10/2016   s/p  LEEP in gyn office   History of COVID-19 12/2019   per pt mild symptoms that resolved   History of ulcerative colitis    per pt dx age 17, was on medication until early age 18s remission since;  followed by dr Wynetta Emery (GI)   Hyperlipidemia    Hypertension    Menorrhagia    OSA (obstructive sleep apnea)    followed by dr c. young---  sleep study in epic 11-05-2020, moderate OSA per pt  cpap on back order   Paroxysmal SVT (supraventricular tachycardia) 09/2017   cardiologist-- dr Stanford Breed,  takes toprol   Wears contact lenses     Past Surgical History:  Procedure Laterality Date   CESAREAN SECTION  10/02/2005   @[facility]    CHOLECYSTECTOMY, LAPAROSCOPIC  03/29/2009   @[facility]    COLONOSCOPY WITH PROPOFOL N/A 06/10/2016   Procedure: COLONOSCOPY WITH PROPOFOL;  Surgeon: Garlan Fair, MD;  Location: [facility];  Service: Endoscopy;  Laterality: N/A;   DILATATION & CURETTAGE/HYSTEROSCOPY WITH MYOSURE N/A 03/27/2021   Procedure: DILATATION & CURETTAGE/HYSTEROSCOPY WITH MYOSURE POLYPECTOMY;  Surgeon: Rowland Lathe, MD;  Location: [facility];  Service: Gynecology;  Laterality: N/A;    Social History   Socioeconomic History  Marital status: Married    Spouse name: Not on file   Number of children: 1   Years of education: Not on file   Highest education level: Not on file  Occupational History   Occupation: SEDENTARY  Tobacco Use   Smoking status: Never   Smokeless tobacco: Never  Vaping Use   Vaping Use: Never used  Substance and Sexual Activity   Alcohol use: Yes    Comment: Occasional   Drug use: Never   Sexual activity: Not on file  Other Topics Concern   Not on file  Social History Narrative   Lives with husband and son.    Social Determinants of  Health   Financial Resource Strain: Not on file  Food Insecurity: Not on file  Transportation Needs: Not on file  Physical Activity: Not on file  Stress: Not on file  Social Connections: Not on file  Intimate Partner Violence: Not on file    Family History  Problem Relation Age of Onset   Hyperlipidemia Mother    Hypertension Father    Atrial fibrillation Father    Heart attack Maternal Grandmother    Diabetes Mellitus I Maternal Grandfather    CAD Paternal Grandfather    Obesity Paternal Grandfather     ROS: no fevers or chills, productive cough, hemoptysis, dysphasia, odynophagia, melena, hematochezia, dysuria, hematuria, rash, seizure activity, orthopnea, PND, pedal edema, claudication. Remaining systems are negative.  Physical Exam: Well-developed well-nourished in no acute distress.  Skin is warm and dry.  HEENT is normal.  Neck is supple.  Chest is clear to auscultation with normal expansion.  Cardiovascular exam is regular rate and rhythm.  Abdominal exam nontender or distended. No masses palpated. Extremities show no edema. neuro grossly intact  ECG-normal sinus rhythm with first-degree AV block, no ST changes.  Personally reviewed  A/P  1 supraventricular tachycardia-she has had occasional SVT that she terminates easily with Valsalva.  She would continue to light to be conservative.  Will continue beta-blocker at this point.  Can consider referral back to electrophysiology if she has more frequent episodes in the future.  2 mildly elevated calcium score-continue statin.  3 hypertension-blood pressure controlled.  Continue present medications and follow-up.  4 hyperlipidemia-Continue statin.  Lipids and liver monitored by primary care.  5 obstructive sleep apnea-Per pulmonary.  Kirk Ruths, MD

## 2022-05-12 ENCOUNTER — Encounter: Payer: Self-pay | Admitting: Cardiology

## 2022-05-12 ENCOUNTER — Ambulatory Visit: Payer: BC Managed Care – PPO | Admitting: Cardiology

## 2022-05-12 VITALS — BP 124/74 | HR 78 | Ht 66.0 in | Wt 251.1 lb

## 2022-05-12 DIAGNOSIS — E7800 Pure hypercholesterolemia, unspecified: Secondary | ICD-10-CM

## 2022-05-12 DIAGNOSIS — I471 Supraventricular tachycardia, unspecified: Secondary | ICD-10-CM

## 2022-05-12 DIAGNOSIS — I10 Essential (primary) hypertension: Secondary | ICD-10-CM

## 2022-05-12 NOTE — Patient Instructions (Signed)
  Follow-Up: At [facility], you and your health needs are our priority.  As part of our continuing mission to provide you with exceptional heart care, we have created designated Provider Care Teams.  These Care Teams include your primary Cardiologist (physician) and Advanced Practice Providers (APPs -  Physician Assistants and Nurse Practitioners) who all work together to provide you with the care you need, when you need it.  We recommend signing up for the patient portal called "MyChart".  Sign up information is provided on this After Visit Summary.  MyChart is used to connect with patients for Virtual Visits (Telemedicine).  Patients are able to view lab/test results, encounter notes, upcoming appointments, etc.  Non-urgent messages can be sent to your provider as well.   To learn more about what you can do with MyChart, go to [email or web address].    Your next appointment:   12 month(s)  Provider:   Kirk Ruths, MD

## 2023-02-13 ENCOUNTER — Other Ambulatory Visit: Payer: Self-pay | Admitting: Obstetrics and Gynecology

## 2023-02-13 DIAGNOSIS — N6310 Unspecified lump in the right breast, unspecified quadrant: Secondary | ICD-10-CM

## 2023-02-13 DIAGNOSIS — N6320 Unspecified lump in the left breast, unspecified quadrant: Secondary | ICD-10-CM

## 2023-02-13 DIAGNOSIS — R928 Other abnormal and inconclusive findings on diagnostic imaging of breast: Secondary | ICD-10-CM

## 2023-03-10 ENCOUNTER — Other Ambulatory Visit: Payer: Self-pay | Admitting: Obstetrics and Gynecology

## 2023-03-10 DIAGNOSIS — N6320 Unspecified lump in the left breast, unspecified quadrant: Secondary | ICD-10-CM

## 2023-03-10 DIAGNOSIS — N6310 Unspecified lump in the right breast, unspecified quadrant: Secondary | ICD-10-CM

## 2023-03-10 DIAGNOSIS — R928 Other abnormal and inconclusive findings on diagnostic imaging of breast: Secondary | ICD-10-CM

## 2023-03-18 ENCOUNTER — Ambulatory Visit
Admission: RE | Admit: 2023-03-18 | Discharge: 2023-03-18 | Disposition: A | Discharge status: Home / Self Care | Payer: BC Managed Care – PPO | Source: Ambulatory Visit | Attending: Obstetrics and Gynecology

## 2023-03-18 ENCOUNTER — Ambulatory Visit
Admission: RE | Admit: 2023-03-18 | Discharge: 2023-03-18 | Disposition: A | Discharge status: Home / Self Care | Payer: BC Managed Care – PPO | Source: Ambulatory Visit | Attending: Obstetrics and Gynecology | Admitting: Obstetrics and Gynecology

## 2023-03-18 DIAGNOSIS — N6320 Unspecified lump in the left breast, unspecified quadrant: Secondary | ICD-10-CM

## 2023-03-18 DIAGNOSIS — N6310 Unspecified lump in the right breast, unspecified quadrant: Secondary | ICD-10-CM

## 2023-07-08 ENCOUNTER — Ambulatory Visit: Payer: Self-pay | Admitting: Emergency Medicine

## 2023-07-15 NOTE — Progress Notes (Unsigned)
 Cardiology Office Note:    Date:  07/16/2023  ID:  Carrie Savage, DOB Oct 07, 1970, MRN [id number] PCP: Debroah Loop, DO  [facility] Providers Cardiologist:  Olga Millers, MD       Patient Profile:      Chief Complaint: 1 year follow-up  History of Present Illness:  Carrie Savage is a 53 y.o. female with visit-pertinent history of SVT, coronary calcium, hypertension, hyperlipidemia, obstructive sleep apnea, aortic atherosclerosis  Patient established with cardiology service in 08/2017 for evaluation of palpitations.  Echocardiogram June 2019 showed normal LV function, mild LVH, grade 1 diastolic function, mild left atrial enlargement.  Patient had an episode of documented supraventricular tachycardia in June 2019.  She was referred to Dr. Ladona Ridgel for consideration of ablation but she preferred conservative measures at that time.  She had a coronary CTA May 2023 that showed calcium score of 0.839 which was 80th percentile but no significant coronary disease was noted.  She did have aortic atherosclerosis.  She was last seen in office on 05/12/2022 by Dr. Jens Som.  She noted she had occasional SVT that terminates easily with Valsalva.  Patient notes she like to continue to be conservative with her SVT.  It was noted that can be considered referral back to EP if she has more frequent episodes in the future.  Discussed the use of AI scribe software for clinical note transcription with the patient, who gave verbal consent to proceed.  History of Present Illness The patient presents today for 1 year follow-up.  She is without acute cardiovascular concerns or complaints.  She reports that the her SVT episodes occur approximately once every other month, lasting less than five minutes. The patient manages these episodes with the Valsalva maneuver and metoprolol as needed. She has been tracking these episodes on her Fitbit. The patient has recently started on Mounjaro for weight loss and has lost 10  pounds since starting the medication. She has also stopped taking amlodipine due to instances of dizziness and good blood pressure control with her primary care provider. The patient is compliant with CPAP therapy for sleep apnea and reports no issues with the therapy.  She denies chest pain, shortness of breath, lower extremity edema, fatigue, diaphoresis, weakness, presyncope, syncope, orthopnea, and PND.  Review of systems:  Please see the history of present illness. All other systems are reviewed and otherwise negative.     Home Medications:    Current Meds  Medication Sig   ALPRAZolam (XANAX) 0.5 MG tablet Take 0.5 mg by mouth 3 (three) times daily as needed for anxiety.   amLODipine (NORVASC) 5 MG tablet Take 5 mg by mouth daily.   Calcium Carbonate-Vitamin D 500-125 MG-UNIT TABS Take 1 tablet by mouth daily.   dextromethorphan-guaiFENesin (MUCINEX DM) 30-600 MG 12hr tablet Take 1 tablet by mouth 2 (two) times daily as needed for cough.   ezetimibe (ZETIA) 10 MG tablet Take 10 mg by mouth at bedtime.   loratadine (CLARITIN) 10 MG tablet Take 10 mg by mouth daily.   metoprolol succinate (TOPROL-XL) 100 MG 24 hr tablet Take 100 mg by mouth daily. Take with or immediately following a meal.   Multiple Vitamins-Minerals (MULTIVITAMIN WITH MINERALS) tablet Take 1 tablet by mouth daily.   omeprazole (PRILOSEC) 40 MG capsule Take 40 mg by mouth daily.   simvastatin (ZOCOR) 80 MG tablet Take 80 mg by mouth at bedtime.   telmisartan (MICARDIS) 80 MG tablet Take 1 tablet (80 mg total) by mouth daily.  Studies Reviewed:   EKG Interpretation Date/Time:  Thursday July 16 2023 08:12:36 EDT Ventricular Rate:  77 PR Interval:  216 QRS Duration:  94 QT Interval:  390 QTC Calculation: 441 R Axis:   -9  Text Interpretation: Sinus rhythm with 1st degree A-V block When compared with ECG of 29-Mar-2009 10:09, Confirmed by Rise Paganini [phone]) on 07/16/2023 8:40:13 AM   Echocardiogram  09/25/2017 - Left ventricle: The cavity size was normal. Wall thickness was    increased in a pattern of mild LVH. Systolic function was normal.    The estimated ejection fraction was in the range of 60% to 65%.    Wall motion was normal; there were no regional wall motion    abnormalities. Doppler parameters are consistent with abnormal    left ventricular relaxation (grade 1 diastolic dysfunction).  - Aortic valve: There was no stenosis.  - Mitral valve: Mildly calcified annulus. There was no significant    regurgitation.  - Left atrium: The atrium was mildly dilated.  - Right ventricle: The cavity size was normal. Systolic function    was normal.  - Pulmonary arteries: No complete TR doppler jet so unable to    estimate PA systolic pressure.  - Inferior vena cava: The vessel was normal in size. The    respirophasic diameter changes were in the normal range (>= 50%),    consistent with normal central venous pressure.   Coronary CTA 08/27/2021 1. Coronary calcium score of 0.839. This was 30 percentile for age-, sex, and race-matched controls.   2. Normal coronary origin with right dominance.   3. No evidence of CAD.   4. Aortic atherosclerosis. Risk Assessment/Calculations:             Physical Exam:   VS:  BP 124/86 (BP Location: Left Arm, Patient Position: Sitting, Cuff Size: Normal)   Pulse 77   Ht 5\' 5"  (1.651 m)   BMI 41.79 kg/m    Wt Readings from Last 3 Encounters:  05/12/22 251 lb 1.9 oz (113.9 kg)  02/20/22 253 lb (114.8 kg)  07/17/21 252 lb (114.3 kg)    GEN: Well nourished, well developed in no acute distress NECK: No JVD; No carotid bruits CARDIAC: RRR, no murmurs, rubs, gallops RESPIRATORY:  Clear to auscultation without rales, wheezing or rhonchi  ABDOMEN: Soft, non-tender, non-distended EXTREMITIES:  No edema; No acute deformity     Assessment and Plan:  Supraventricular tachycardia Intermittent SVT episodes occur bimonthly, which are short lasting  and resolving with Valsalva. Ablation not pursued in the past due to low frequency and short duration of her episodes.  Patient desires to continue her current medication management unless her episodes increase in frequency or duration - Echocardiogram 2019 normal LVEF and no valvular abnormality - Continue Valsalva maneuver and metoprolol as needed - Continue to monitor heart rate and rhythm with Fitbit - Consider EP referral for ablation if episodes increase  Coronary artery disease / Aortic atherosclerosis Coronary CTA 08/2021 with CAC score of 0.839 (80th percentile) with evidence of aortic atherosclerosis - Today and over the past year she has been without any ischemic symptoms, no indication for further ischemic evaluation at this time - Continue with weight loss.  Now on Mounjaro - Continue simvastatin 80 mg daily  Hypertension Blood pressure today is 124/86 Her diastolic is slightly above goal.  Amlodipine was recently discontinued, with blood pressure control and weight loss by her PCP - Monitor BP at least 3 times weekly.  If BP  remains above goal of <130/80 would recommend addition of amlodipine 2.5 mg - Healthy dieting strongly encouraged - Continue telmisartan 80 mg daily and metoprolol XL 100 mg daily  Hyperlipidemia LDL 110 on 04/2023 Her LDL is above goal <70.  This is being managed by her PCP. - With recent weight loss and lifestyle changes I suspect her LDL to improve.  She will continue with lipid management per her primary.  Suggest in future if unable to get LDL to goal can consider switching simvastatin to high intensity statin - Continue simvastatin 80 mg daily and zetia 10 mg daily  Obstructive sleep apnea - Remains adherent to CPAP     Dispo:  Return in about 1 year (around 07/15/2024).  Signed, Denyce Robert, NP

## 2023-07-16 ENCOUNTER — Encounter: Payer: Self-pay | Admitting: Emergency Medicine

## 2023-07-16 ENCOUNTER — Ambulatory Visit: Payer: Self-pay | Attending: Emergency Medicine | Admitting: Emergency Medicine

## 2023-07-16 VITALS — BP 124/86 | HR 77 | Ht 65.0 in

## 2023-07-16 DIAGNOSIS — I471 Supraventricular tachycardia, unspecified: Secondary | ICD-10-CM | POA: Diagnosis not present

## 2023-07-16 DIAGNOSIS — I10 Essential (primary) hypertension: Secondary | ICD-10-CM | POA: Diagnosis not present

## 2023-07-16 DIAGNOSIS — I2510 Atherosclerotic heart disease of native coronary artery without angina pectoris: Secondary | ICD-10-CM

## 2023-07-16 DIAGNOSIS — E785 Hyperlipidemia, unspecified: Secondary | ICD-10-CM | POA: Diagnosis not present

## 2023-07-16 DIAGNOSIS — G4733 Obstructive sleep apnea (adult) (pediatric): Secondary | ICD-10-CM

## 2023-07-16 NOTE — Patient Instructions (Signed)
 Medication Instructions:  NO CHANGES   Lab Work: NONE   Testing/Procedures: NONE  Follow-Up: At [facility], you and your health needs are our priority.  As part of our continuing mission to provide you with exceptional heart care, our providers are all part of one team.  This team includes your primary Cardiologist (physician) and Advanced Practice Providers or APPs (Physician Assistants and Nurse Practitioners) who all work together to provide you with the care you need, when you need it.  Your next appointment:   1 YEAR  Provider:   Olga Millers, MD or Rise Paganini, DNP   We recommend signing up for the patient portal called "MyChart".  Sign up information is provided on this After Visit Summary.  MyChart is used to connect with patients for Virtual Visits (Telemedicine).  Patients are able to view lab/test results, encounter notes, upcoming appointments, etc.  Non-urgent messages can be sent to your provider as well.   To learn more about what you can do with MyChart, go to [email or web address].   Other Instructions:      1st Floor: - Lobby - Registration  - Pharmacy  - Lab - Cafe  2nd Floor: - PV Lab - Diagnostic Testing (echo, CT, nuclear med)  3rd Floor: - Vacant  4th Floor: - TCTS (cardiothoracic surgery) - AFib Clinic - Structural Heart Clinic - Vascular Surgery  - Vascular Ultrasound  5th Floor: - [facility] (general and EP) - Clinical Pharmacy for coumadin, hypertension, lipid, weight-loss medications, and med management appointments    Valet parking services will be available as well.

## 2024-02-26 ENCOUNTER — Other Ambulatory Visit: Payer: Self-pay | Admitting: Obstetrics and Gynecology

## 2024-02-26 DIAGNOSIS — Z1231 Encounter for screening mammogram for malignant neoplasm of breast: Secondary | ICD-10-CM

## 2024-03-23 ENCOUNTER — Ambulatory Visit
Admission: RE | Admit: 2024-03-23 | Discharge: 2024-03-23 | Disposition: A | Discharge status: Home / Self Care | Source: Ambulatory Visit | Attending: Obstetrics and Gynecology | Admitting: Obstetrics and Gynecology

## 2024-03-23 DIAGNOSIS — Z1231 Encounter for screening mammogram for malignant neoplasm of breast: Secondary | ICD-10-CM
